# Patient Record
Sex: Female | Born: 1940 | Race: White | Hispanic: No | Marital: Single | Smoking: Former smoker
Health system: Southern US, Community
[De-identification: ages and names within clinical notes are randomized; demographics above are authoritative.]

## PROBLEM LIST (undated history)

## (undated) DIAGNOSIS — M545 Low back pain, unspecified: Secondary | ICD-10-CM

## (undated) DIAGNOSIS — M4800 Spinal stenosis, site unspecified: Secondary | ICD-10-CM

## (undated) DIAGNOSIS — IMO0002 Reserved for concepts with insufficient information to code with codable children: Secondary | ICD-10-CM

## (undated) DIAGNOSIS — M1990 Unspecified osteoarthritis, unspecified site: Secondary | ICD-10-CM

## (undated) HISTORY — DX: Low back pain, unspecified: M54.50

## (undated) HISTORY — DX: Unspecified osteoarthritis, unspecified site: M19.90

## (undated) HISTORY — DX: Spinal stenosis, site unspecified: M48.00

## (undated) HISTORY — DX: Low back pain: M54.5

## (undated) HISTORY — DX: Reserved for concepts with insufficient information to code with codable children: IMO0002

---

## 1997-05-27 ENCOUNTER — Ambulatory Visit (HOSPITAL_COMMUNITY): Admission: RE | Admit: 1997-05-27 | Discharge: 1997-05-27 | Payer: Self-pay | Admitting: Orthopedic Surgery

## 1998-12-05 ENCOUNTER — Encounter: Admission: RE | Admit: 1998-12-05 | Discharge: 1998-12-05 | Payer: Self-pay | Admitting: Obstetrics and Gynecology

## 1999-06-21 ENCOUNTER — Ambulatory Visit (HOSPITAL_COMMUNITY): Admission: RE | Admit: 1999-06-21 | Discharge: 1999-06-21 | Payer: Self-pay | Admitting: Orthopedic Surgery

## 1999-06-21 ENCOUNTER — Encounter: Payer: Self-pay | Admitting: Orthopedic Surgery

## 2000-08-07 ENCOUNTER — Encounter: Payer: Self-pay | Admitting: Obstetrics and Gynecology

## 2000-08-07 ENCOUNTER — Encounter: Admission: RE | Admit: 2000-08-07 | Discharge: 2000-08-07 | Payer: Self-pay | Admitting: Obstetrics and Gynecology

## 2001-05-07 ENCOUNTER — Encounter: Payer: Self-pay | Admitting: Rheumatology

## 2001-05-07 ENCOUNTER — Encounter: Admission: RE | Admit: 2001-05-07 | Discharge: 2001-05-07 | Payer: Self-pay | Admitting: Rheumatology

## 2001-10-20 ENCOUNTER — Encounter: Payer: Self-pay | Admitting: Rheumatology

## 2001-10-20 ENCOUNTER — Encounter: Admission: RE | Admit: 2001-10-20 | Discharge: 2001-10-20 | Payer: Self-pay | Admitting: Rheumatology

## 2001-12-26 ENCOUNTER — Encounter: Admission: RE | Admit: 2001-12-26 | Discharge: 2001-12-26 | Payer: Self-pay | Admitting: Orthopedic Surgery

## 2001-12-26 ENCOUNTER — Encounter: Payer: Self-pay | Admitting: Orthopedic Surgery

## 2002-01-19 ENCOUNTER — Encounter: Payer: Self-pay | Admitting: Orthopedic Surgery

## 2002-01-26 ENCOUNTER — Encounter: Payer: Self-pay | Admitting: Orthopedic Surgery

## 2002-01-26 ENCOUNTER — Inpatient Hospital Stay (HOSPITAL_COMMUNITY): Admission: RE | Admit: 2002-01-26 | Discharge: 2002-01-30 | Payer: Self-pay | Admitting: Orthopedic Surgery

## 2002-01-26 HISTORY — PX: LUMBAR LAMINECTOMY: SHX95

## 2002-01-28 ENCOUNTER — Encounter: Payer: Self-pay | Admitting: Orthopedic Surgery

## 2002-06-23 ENCOUNTER — Ambulatory Visit (HOSPITAL_COMMUNITY): Admission: RE | Admit: 2002-06-23 | Discharge: 2002-06-23 | Payer: Self-pay | Admitting: Obstetrics and Gynecology

## 2002-06-23 ENCOUNTER — Encounter: Payer: Self-pay | Admitting: Obstetrics and Gynecology

## 2003-07-07 ENCOUNTER — Ambulatory Visit (HOSPITAL_COMMUNITY): Admission: RE | Admit: 2003-07-07 | Discharge: 2003-07-07 | Payer: Self-pay | Admitting: Obstetrics and Gynecology

## 2003-11-01 ENCOUNTER — Encounter: Admission: RE | Admit: 2003-11-01 | Discharge: 2003-11-01 | Payer: Self-pay | Admitting: Urology

## 2003-11-05 ENCOUNTER — Ambulatory Visit (HOSPITAL_BASED_OUTPATIENT_CLINIC_OR_DEPARTMENT_OTHER): Admission: RE | Admit: 2003-11-05 | Discharge: 2003-11-05 | Payer: Self-pay | Admitting: Urology

## 2003-11-05 ENCOUNTER — Ambulatory Visit (HOSPITAL_COMMUNITY): Admission: RE | Admit: 2003-11-05 | Discharge: 2003-11-05 | Payer: Self-pay | Admitting: Urology

## 2003-11-05 HISTORY — PX: OTHER SURGICAL HISTORY: SHX169

## 2004-08-22 ENCOUNTER — Ambulatory Visit (HOSPITAL_COMMUNITY): Admission: RE | Admit: 2004-08-22 | Discharge: 2004-08-22 | Payer: Self-pay | Admitting: Obstetrics and Gynecology

## 2004-09-01 ENCOUNTER — Ambulatory Visit (HOSPITAL_COMMUNITY): Admission: RE | Admit: 2004-09-01 | Discharge: 2004-09-01 | Payer: Self-pay | Admitting: Obstetrics and Gynecology

## 2004-09-01 ENCOUNTER — Encounter: Admission: RE | Admit: 2004-09-01 | Discharge: 2004-09-01 | Payer: Self-pay | Admitting: Obstetrics and Gynecology

## 2005-02-23 ENCOUNTER — Encounter: Admission: RE | Admit: 2005-02-23 | Discharge: 2005-02-23 | Payer: Self-pay | Admitting: Internal Medicine

## 2007-06-26 ENCOUNTER — Ambulatory Visit (HOSPITAL_COMMUNITY): Admission: RE | Admit: 2007-06-26 | Discharge: 2007-06-26 | Payer: Self-pay

## 2008-07-15 ENCOUNTER — Ambulatory Visit (HOSPITAL_COMMUNITY): Admission: RE | Admit: 2008-07-15 | Discharge: 2008-07-15 | Payer: Self-pay | Admitting: *Deleted

## 2008-07-30 ENCOUNTER — Encounter: Admission: RE | Admit: 2008-07-30 | Discharge: 2008-07-30 | Payer: Self-pay | Admitting: Internal Medicine

## 2008-09-03 ENCOUNTER — Encounter (INDEPENDENT_AMBULATORY_CARE_PROVIDER_SITE_OTHER): Payer: Self-pay | Admitting: Gastroenterology

## 2008-09-03 ENCOUNTER — Ambulatory Visit (HOSPITAL_COMMUNITY): Admission: RE | Admit: 2008-09-03 | Discharge: 2008-09-03 | Payer: Self-pay | Admitting: Gastroenterology

## 2008-09-03 HISTORY — PX: COLONOSCOPY W/ BIOPSIES: SHX1374

## 2009-07-08 ENCOUNTER — Ambulatory Visit (HOSPITAL_BASED_OUTPATIENT_CLINIC_OR_DEPARTMENT_OTHER): Admission: RE | Admit: 2009-07-08 | Discharge: 2009-07-08 | Payer: Self-pay | Admitting: Surgery

## 2009-07-08 HISTORY — PX: OTHER SURGICAL HISTORY: SHX169

## 2009-11-02 ENCOUNTER — Encounter: Admission: RE | Admit: 2009-11-02 | Discharge: 2009-11-02 | Payer: Self-pay | Admitting: *Deleted

## 2010-01-29 ENCOUNTER — Encounter: Payer: Self-pay | Admitting: Obstetrics and Gynecology

## 2010-05-23 NOTE — Op Note (Signed)
[name]                 ACCOUNT NO.:  [id number]   MEDICAL RECORD NO.:  [id number]          PATIENT TYPE:  AMB   LOCATION:  ENDO                         FACILITY:  [facility]   PHYSICIAN:  Bernette Redbird, M.D.   DATE OF BIRTH:  [birth year]   DATE OF PROCEDURE:  09/03/2008  DATE OF DISCHARGE:  09/03/2008                               OPERATIVE REPORT   PROCEDURE:  Colonoscopy with biopsy.   INDICATION:  Family history of colon cancer in the patient's mother when  she was in her 27s, now perhaps 7 years status post her most recent  screening exam.   FINDINGS:  Several small sessile polyps.  Mild sigmoid diverticulosis.   PROCEDURE:  The patient was familiar with the procedure from prior  examinations and offered the opportunity to inquire about the risks of  the procedure.  She had provided written consent.  Sedation was Versed  10 mg IV.  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   The sedation, however, worked well.  She was comfortable and there was  no clinical instability during the procedure.   The Pentax pediatric video colonoscope was advanced quite easily around  the colon to the terminal ileum, applying some external abdominal  compression to help advancement in the proximal colon.   The quality of prep was excellent and it was felt that all areas were  well seen.   The terminal ileum looked normal.   In the ascending colon and in the descending colon at about 35 cm, I  encountered a small sessile roughly 3-mm hyperplastic appearing polyp  removed by cold biopsy in each location.  Two similar polyps were noted  in the proximal rectum at about 15 cm from the anus.   No large polyps, cancer, colitis or vascular ectasia were noted.   There was mild sigmoid  diverticulosis.   Retroflexion in the rectum and reinspection of the rectum showed no  additional findings.   The patient tolerated the procedure well and there were no apparent  complications.   IMPRESSION:  1. Small colon polyps removed as described above.  2. Family history of colon cancer at an advanced age.  3. Mild sigmoid diverticulosis.   PLAN:  Await pathology on the polyps, which will help guide the decision  regarding the patient's surveillance interval before the next  colonoscopy.           ______________________________  Bernette Redbird, M.D.     RB/MEDQ  D:  09/03/2008  T:  09/04/2008  Job:  [id number]   cc:   Dawn Liter, MD

## 2010-05-26 NOTE — Consult Note (Signed)
NAMEJASNOOR, TRUSSELL                           ACCOUNT NO.:  [id number]   MEDICAL RECORD NO.:  [id number]                   PATIENT TYPE:  INP   LOCATION:  0472                                 FACILITY:  [facility]   PHYSICIAN:  Bernette Redbird, M.D.                DATE OF BIRTH:  [birth year]   DATE OF CONSULTATION:  DATE OF DISCHARGE:                                   CONSULTATION   REASON FOR CONSULTATION:  Dr. Mikey Kirschner physician assistant asked me to see  this 70 year old female because of diarrhea.   HISTORY OF PRESENT ILLNESS:  Ms. Babin is two days status post  decompressive L4-L5 laminectomy which went smoothly and she was having a  very nice postoperative convalescence until yesterday evening when severe  abdominal cramps set in and then she developed a temperature to 104 degrees  with white count elevated to 14,000 and diarrhea and crampy abdominal pain.  The stool was Hemoccult positive. Of note, the patient had been on Keflex  for about one week prior to surgery and then had a day or so of Ancef around  the time of surgery. The patient has had previous colonoscopy by Dr.  Sherin Quarry because of a family history of colon cancer. There is no history of  chronic colitis or of chronic diarrhea. CT scan was obtained earlier today  which shows evidence of colitis in the transverse and descending colon  regions. The patient has received one dose of Flagyl so far while two  specimens for C-diff are pending, and the patient has already begun to note  some marked improvement in her symptoms.   PHYSICAL EXAMINATION:  GENERAL: Ms. Rhodus looks well. She is in no distress  whatsoever and is in good spirits.  CHEST: Clear.  HEART: Normal.  ABDOMEN: Has rather active gurgling bowel sounds which are non-obstructive  in character. There is mild subjective abdominal tenderness but really no  significant objective tenderness. No guarding. No mass effect and certainly,  no peritoneal  findings.   IMPRESSION:  Probable C-diff colitis, based on all features, which include  fever, leukocytosis, watery diarrhea and antecedent exposure to antibiotics.  Less likely would be ischemic colitis, given the heme positivity.    PLAN:  Proceed with sigmoidoscopic evaluation tomorrow. The nature, purpose,  and risks were reviewed with the patient and she is agreeable to proceed. We  will also await the C-diff results.                                               Bernette Redbird, M.D.    RB/MEDQ  D:  01/28/2002  T:  01/29/2002  Job:  [id number]   cc:   Hetty Blend, M.D.   Darius Bump, M.D.  [address]  [place]  [place] [address]  Fax: [phone]   Tasia Catchings, M.D.  [address]  [place]  [place] [address]  Fax: [phone]

## 2010-05-26 NOTE — Op Note (Signed)
NAMEGWENDY, [name]                 ACCOUNT NO.:  [id number]   MEDICAL RECORD NO.:  [id number]          PATIENT TYPE:  AMB   LOCATION:  NESC                         FACILITY:  [facility]   PHYSICIAN:  Mark C. Vernie Ammons, M.D.  DATE OF BIRTH:  12-18-1940   DATE OF PROCEDURE:  11/05/2003  DATE OF DISCHARGE:                                 OPERATIVE REPORT   PREOPERATIVE DIAGNOSIS:  Stress urinary incontinence.   POSTOPERATIVE DIAGNOSIS:  Stress urinary incontinence.   PROCEDURE:  Suprapubic sling.   SURGEON:  Mark C. Vernie Ammons, M.D.   DRAINS:  16-French Foley catheter.   BLOOD LOSS:  Approximately 25 cc.   SPECIMENS:  None.   COMPLICATIONS:  None.   INDICATIONS:  The patient is a 70 year old white female with pure stress  urinary incontinence. It has been present for some time and progressively  worsening. She is brought to the OR today for sling procedure and  understands the risks, complications, and alternatives to the procedure.   DESCRIPTION OF OPERATION:  After informed consent, the patient was brought  to the OR, placed on a table, and administered general anesthesia, then  moved to the dorsal lithotomy position. Her lower abdomen, genitalia, and  vagina were then sterilely prepped and draped. A 16-French Foley catheter  was placed in the bladder and a weighted speculum in the vagina. I then  infiltrated the suburethral mucosa with 1% lidocaine with epinephrine. After  allowing adequate time for epinephrine effect during which time I made a  suprapubic stab incisions at the superior border of the symphysis pubis on  the right and left side approximately four fingerbreadths apart. I then made  an incision at the mid urethral level, through the vaginal mucosa, and used  the Metzenbaum scissors to dissect the vaginal mucosa from the periurethral  region until I could easily place an index finger in this location and  palpate the undersurface of the symphysis pubis. This was  performed on the  right and left side. I then completely drained the bladder through the Foley  catheter and passed the sling trocar through the suprapubic incision, back  behind the symphysis pubis first on the left side, and out through the mid  urethra located by palpation. The blood tubing which was connected to the  trocar was then pulled through, cut, and clamped on each side, and the  trocar reloaded and passed on the right side in identical fashion. Leaving  the blue tubing on both sides, I then removed the Foley catheter and  inserted the cystoscope with a 70-degree lens. The bladder was fully  inspected and noted to be free of any tumors, stones, or inflammatory  lesions. There was an area on the right hand side where it appeared the  tubing had transversed the bladder mucosa where just a small portion of the  blue tubing could be seen approximately half a centimeter in length. I  therefore removed the tubing on the right hand side, repassed the trocar  with the bladder empty, and reinspected the bladder noting no perforation,  foreign body, or other  abnormality. I then affixed the obturator tape  material to the ends of each of the blue tubes, coming through the vaginal  incision, brought these up through the suprapubic stab incisions, then  adjusted the tape at the mid urethral level with a right ankle clamp placed  beneath this. I removed the plastic sheathing over the obturator tape. After  doing this, I made sure that the tape was under no tension and excised the  excess material. The suprapubic stab incisions as well as the vaginal  incision were then copiously irrigated with antibiotic solution. No bleeding  was noted to be occurring from any of the incisions, so I therefore closed  the vaginal incisions with a running 2-0 Vicryl suture. The suprapubic  incisions were closed with Dermabond. The Foley catheter will be left  indwelling overnight and removed by the patient in  the morning. She will  follow up with me in the office in 1 week. She will be given a prescription  for Vicoprofen #36 and Cipro 250 mg b.i.d. [place]   MCO/MEDQ  D:  11/05/2003  T:  11/05/2003  Job:  [id number]
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
## 2010-05-26 NOTE — Op Note (Signed)
[name], [name]                           ACCOUNT NO.:  [id number]   MEDICAL RECORD NO.:  [id number]                   PATIENT TYPE:  INP   LOCATION:  X001                                 FACILITY:  [facility]   PHYSICIAN:  Georges Lynch. Darrelyn Hillock, M.D.             DATE OF BIRTH:  [birth year]   DATE OF PROCEDURE:  01/26/2002  DATE OF DISCHARGE:                                 OPERATIVE REPORT   SURGEON:  Georges Lynch. Darrelyn Hillock, M.D.   ASSISTANT:  Jene Every, M.D.   PREOPERATIVE DIAGNOSES:  Recess and spinal stenosis at L4-5 and also L3-4.  All of her symptoms were in the right lower extremity and rather severe. Her  stenosis was proven by a myelogram and CAT scan.   POSTOPERATIVE DIAGNOSES:  Recess and spinal stenosis at L4-5 and also L3-4.  All of her symptoms were in the right lower extremity and rather severe.   OPERATION:  Lumbar decompressive lumbar laminectomy complete at L4-5 and  hemilaminectomy at L3-4 and L5-S1 bilaterally with foraminotomies.   DESCRIPTION OF PROCEDURE:  Under general anesthesia, the patient on a spinal  frame, routine orthopedic prep and draping of the back was carried out. She  had 1 gm of IV Ancef. Two needles were placed on the back for localization  purposes, an x-ray was taken to verify our position. Once we identified the  spinous processes of L4 and L5. An incision was made and bleeders identified  and cauterized, the muscle was stripped from the spinous processes of L3, L4  and L5 and we went down and identified the lamina. Following this, we then  inserted our self retaining retractors, another x-ray was taken to verify  our L4-5 position. I then removed the spinous process of L4, removed part of  the spinous process of L5 and part of L3. I then did a complete bilateral  decompressive lumbar laminectomy at L4-5 and I did hemilaminectomies above  at 3-4 region as well as 5-1 distally. We did foraminotomies. She had a  markedly thickened overgrowth of  the ligamentum flavum. This was actually  indenting her thecal sac but no herniated disk. We did a nice decompression,  we were able to easily pass the hockey stick out the foramina after we  completed the procedure. We had good control of the hemostasis, thoroughly  irrigated out the wound. We loosely applied some thrombin soaked Gelfoam to  the dura and then closed the wound in layers in the usual fashion. I did  leave the deep portion of the wound open distally for drainage purposes in  case she did have any bleeding so she decompress the dura. Sterile dressings  were applied. She left the operating room in satisfactory condition.  Ronald A. Darrelyn Hillock, M.D.    RAG/MEDQ  D:  01/26/2002  T:  01/26/2002  Job:  [id number]

## 2010-05-26 NOTE — Op Note (Signed)
[name], [name]                           ACCOUNT NO.:  [id number]   MEDICAL RECORD NO.:  [id number]                   PATIENT TYPE:  INP   LOCATION:  0472                                 FACILITY:  [facility]   PHYSICIAN:  Bernette Redbird, M.D.                DATE OF BIRTH:  04/18/1940   DATE OF PROCEDURE:  01/29/2002  DATE OF DISCHARGE:                                 OPERATIVE REPORT   PROCEDURE:  Flexible sigmoidoscopy.   INDICATION:  A 70 year old female now three days status post lumbar  laminectomy, who developed abdominal cramps, diarrhea, and low-grade fevers  about a day and a half postop.  C. difficile studies are pending.  Her CT  scan raised the question of some patchy colitis in the transverse and  descending colon.   FINDINGS:  Normal exam to 45 cm except a few small tics.   DESCRIPTION OF PROCEDURE:  The nature, purpose, and risks of the procedure  had been discussed with the patient who provided written consent.  She was  brought to the endoscopy unit.  No prep was administered in view of her  recent diarrhea.   The Olympus adjustable-tension pediatric video colonoscope was advanced to  45 cm without significant difficulty, but there upon we began to experience  some discomfort due to looping.  It was elected to terminate the procedure  at this point and begin withdrawal.   There were a few small diverticula, but this was otherwise a normal exam.  No polyps, cancer and in particular, no pseudomembranes or evidence of  colitis were noted up to the level of the exam which was thought to have  included the distal descending colon.  No biopsies were obtained.  The  patient tolerated the procedure well, and there were no apparent  complications.  Retroflexion was not performed.   IMPRESSION:  No abnormalities to account for diarrhea or radiographic  abnormalities on the CT scan.   PLAN:  1. It is possible that the patient does have C. difficile colitis but  that     the distribution of that has spared the rectosigmoid region (this would     be unusual, but certainly possible).  2. Therefore, I would favor continuing empiric Flagyl, at least until the C.     difficile specimens (there are two pending at this time, and they should     be ready in the next 24 hours) come back negative.  3. I think it is okay to advance the patient to a low residue diet.  4. We need to continue to monitor her symptoms and overall clinical status.                                               Molly Maduro  Buccini, M.D.    RB/MEDQ  D:  01/29/2002  T:  01/29/2002  Job:  [id number]   cc:   Windy Fast A. Darrelyn Hillock, M.D.  [address]  [place]  [place] [address]  Fax: [phone]   Tasia Catchings, M.D.  [address]  [place]  [place] [address]  Fax: [phone]   Darius Bump, M.D.  [phone]  [place] [address]  Fax: [phone]

## 2010-05-26 NOTE — H&P (Signed)
[name], [name]                           ACCOUNT NO.:  [id number]   MEDICAL RECORD NO.:  [id number]                   PATIENT TYPE:  INP   LOCATION:  NA                                   FACILITY:  [facility]   PHYSICIAN:  Ebbie Ridge. Paitsel, P.A.               DATE OF BIRTH:  10/26/1940   DATE OF ADMISSION:  DATE OF DISCHARGE:                                HISTORY & PHYSICAL   DISPOSITION:  For surgery January 26, 2002.   CHIEF COMPLAINT:  Low back pain for 4-5 months.   HISTORY OF PRESENT ILLNESS:  The patient has had back pain radiating into  her right buttock and right thigh for the past 4-5 months.  The patient has  occasional numbness and tingling in her right foot.  The pain has been  progressing in severity.  She is having some weakness in her leg at this  time.  Due to the progressive nature of her pain, the patient underwent a CT  myelogram which revealed severe spinal stenosis at L4-L5.  The patient will  need a complete bilateral decompressive lumbar laminectomy at L4-L5 with  foraminotomies.  The patient has elected to go ahead and have this surgery  done to try to relieve her symptoms.   ALLERGIES:  None known.   MEDICATIONS:  1. Premarin 0.625 mg daily.  2. Vioxx 25 mg daily.   PAST MEDICAL HISTORY:  1. Previous UTI in 1982.  2. Osteoarthritis.  3. Degenerative disk disease.   PRIMARY CARE PHYSICIAN:  Darius Bump, M.D.   PAST SURGICAL HISTORY:  1. Bilateral elbow surgeries in 1980s.  2. Hysterectomy in 1980.  3. Blepharoplasty in the 1980s.   FAMILY HISTORY:  Mother had breast and colon cancer.  Father died of lung  cancer.   REVIEW OF SYSTEMS:  GENERAL:  Denies weight changes, fever, chills, fatigue.  HEENT:  Denies headache, visual changes, tinnitus, hearing loss, or sore  throat.  CARDIOVASCULAR:  Denies chest pain, palpitations, shortness of  breath, hypertension.  PULMONARY:  Denies dyspnea, wheezing, cough, sputum,  or hemoptysis.  GI:   Denies dysphagia, nausea, vomiting, hematemesis, or  abdominal pain. GU:  Denies dysuria, frequency, urgency, incontinence, or  hematuria.  ENDOCRINE:  Denies polyuria, polydipsia, appetite change, heat  or cold intolerance.  MUSCULOSKELETAL:  Does have low back pain radiating  into her right buttock and right thigh.  NEUROLOGICAL:  The patient does  have some paresthesias and weakness in her right lower extremity.  Denies  vertigo, syncope, or seizure.  SKIN:  Denies itching, rashes, masses, or  moles.   PHYSICAL EXAMINATION:  VITAL SIGNS:  Temperature 97.8, pulse 72,  respirations 18, blood pressure 138/70 right arm sitting.  GENERAL:  A well-developed, well-nourished white female 70 years old in no  acute distress.  HEENT:  PERRL, EOMs intact, pharynx clear, TMs clear.  NECK:  Supple without  masses, no carotid bruits heard.  CHEST:  Clear to auscultation bilaterally.  No wheezing, rales, or rhonchi  noted.  HEART:  Regular rate and rhythm without murmur, gallop, or rub.  ABDOMEN:  Positive bowel sounds.  Soft, flat, nontender.  No organomegaly or  abnormal masses.  EXTREMITIES:  The patient has full range of motion of her lower extremities.  Distal pulses are intact.  BACK:  Some pain with hyperextension which reproduces her symptoms in her  right buttock and thigh.  SKIN:  Warm and dry although she does have a quarter size area of  inflammation on her right lower extremity.   IMAGING STUDIES:  CT myelogram revealed severe spinal stenosis L4-L5.  MRI  of her LS spine revealed some significant spinal stenosis at multiple levels  the worst at L3-L4, L4-L5.   IMPRESSION:  Severe spinal stenosis L4-L5.   PLAN:  The patient will be started on Keflex 500 mg q.i.d. for five days due  to the area of erythema on her right lower extremity.  Will be admitted to  [facility] January 26, 2002 to undergo complete bilateral  decompressive lumbar laminectomy.                                                Ebbie Ridge. Paitsel, P.A.    LKP/MEDQ  D:  01/22/2002  T:  01/22/2002  Job:  [id number]

## 2010-11-24 ENCOUNTER — Other Ambulatory Visit (HOSPITAL_COMMUNITY): Payer: Self-pay | Admitting: Family Medicine

## 2010-11-24 DIAGNOSIS — Z1231 Encounter for screening mammogram for malignant neoplasm of breast: Secondary | ICD-10-CM

## 2010-12-26 ENCOUNTER — Ambulatory Visit (HOSPITAL_COMMUNITY): Payer: Self-pay

## 2011-02-01 ENCOUNTER — Ambulatory Visit (HOSPITAL_COMMUNITY): Payer: Commercial Managed Care - PPO | Attending: Family Medicine

## 2011-03-07 ENCOUNTER — Encounter: Payer: Self-pay | Admitting: Vascular Surgery

## 2011-03-09 ENCOUNTER — Encounter: Payer: Self-pay | Admitting: Vascular Surgery

## 2011-03-12 ENCOUNTER — Encounter: Payer: Self-pay | Admitting: Vascular Surgery

## 2011-03-12 ENCOUNTER — Ambulatory Visit (INDEPENDENT_AMBULATORY_CARE_PROVIDER_SITE_OTHER): Payer: Commercial Managed Care - PPO | Admitting: *Deleted

## 2011-03-12 ENCOUNTER — Ambulatory Visit (INDEPENDENT_AMBULATORY_CARE_PROVIDER_SITE_OTHER): Payer: Commercial Managed Care - PPO | Admitting: Vascular Surgery

## 2011-03-12 VITALS — BP 141/91 | HR 87 | Resp 18 | Ht 65.0 in | Wt 147.0 lb

## 2011-03-12 DIAGNOSIS — M7989 Other specified soft tissue disorders: Secondary | ICD-10-CM

## 2011-03-12 DIAGNOSIS — I83893 Varicose veins of bilateral lower extremities with other complications: Secondary | ICD-10-CM | POA: Insufficient documentation

## 2011-03-12 NOTE — Progress Notes (Signed)
Addended by: Clementeen Hoof on: 03/12/2011 04:24 PM   Modules accepted: Orders

## 2011-03-12 NOTE — Progress Notes (Signed)
Subjective:     Patient ID: [name], female   DOB: [birth year], 71 y.o.   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 She describes throbbing aching and burning discomfort in the distal thigh and calf as the day progresses. 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 She does were short-leg elastic compression stockings which did not completely relieve her symptoms. She does develop increasing edema as the day progresses. She has no history of stasis ulcers, bleeding, thrombophlebitis, or DVT. She is unable to elevate her legs at work. He has no significant symptoms in the right leg. She has a remote history of sclerotherapy for spider veins in the right thigh  Past Medical History  Diagnosis Date  . Osteoarthritis   . Low back pain   . Degenerative disc disease   . Spinal stenosis     L4-L5    History  Substance Use Topics  . Smoking status: Former Smoker -- 5 years    Types: Cigarettes    Quit date: 03/12/1978  . Smokeless tobacco: Not on file  . Alcohol Use: Yes    Family History  Problem Relation Age of Onset  . Cancer Mother     BREAST AND COLON  . Cancer Father     LUNG    Allergies  Allergen Reactions  . Keflex     No current outpatient prescriptions on file.  BP 141/91  Pulse 87  Resp 18  Ht 5\' 5"  (1.651 m)  Wt 147 lb (66.679 kg)  BMI 24.46 kg/m2  Body mass index is 24.46 kg/(m^2).        Review of Systems denies chest pain, dyspnea on exertion, PND, orthopnea, claudication. Does have multiple joint problems with osteoarthritis. All other systems are negative and a complete review of systems     Objective:   Physical Exam pressure 141/91 heart rate 87 respirations 18 Gen.-alert and oriented x3 in no apparent distress HEENT normal for age Lungs no rhonchi or wheezing Cardiovascular regular rhythm no murmurs carotid pulses 3+  palpable no bruits audible Abdomen soft nontender no palpable masses Musculoskeletal free of  major deformities Skin clear -no rashes Neurologic normal Lower extremities 3+ femoral and dorsalis pedis pulses palpable bilaterally with 1+ edema on the left. Bulging varicosities in the left leg beginning in the medial thigh extending anterolaterally across the knee and down into the pre-tibial region and also medially proximal to the medial malleolus. There are diffuse spider veins in the right leg on the anterior thigh. There is no hyperpigmentation or ulceration noted.  Today I ordered bilateral venous duplex exam which are reviewed and interpreted. Left leg has no DVT has gross reflux in the great saphenous vein from the saphenofemoral junction to the knee supplying these varicosities. Right leg has some mild reflux throughout the great saphenous vein but it is not a large vein and there is no DVT on the right.    Assessment:     Severe venous insufficiency left leg with gross reflux left great saphenous veins with secondary bulging varicosities which are increasingly symptomatic. These are affecting her daily living and ability to work.    Plan:     #1 long-leg elastic compression stockings 20-30 mm gradient #2 elevation legs when feasible #3 ibuprofen on a daily basis #4 return in 3 months. If there is a significant improvement in symptomatology left leg  believe we should proceed with laser ablation left great saphenous vein with followup 3 months later to see a stab phlebectomy will be necessary

## 2011-03-21 NOTE — Procedures (Unsigned)
LOWER EXTREMITY VENOUS REFLUX EXAM  INDICATION:  Swelling.  EXAM:  Using color-flow imaging and pulse Doppler spectral analysis, the bilateral common femoral, superficial femoral, popliteal, posterior tibial, greater and lesser saphenous veins are evaluated.  There is evidence suggesting deep venous reflux of >500 milliseconds noted in the bilateral common femoral veins.  The bilateral saphenofemoral junctions and nontortuous GSV's demonstrate reflux of >573milliseconds with the calibers as described below.  The bilateral proximal short saphenous vein demonstrate competency.  GSV Diameter (used if found to be incompetent only)                                                 Right     Left Proximal Greater Saphenous Vein                 0.35 cm   0.54 cm Proximal-to-mid-thigh                           cm        cm Mid thigh                                       0.33 cm   0.54 cm Mid-distal thigh                                cm        cm Distal thigh                                    0.34 cm   0.64 cm Knee                                            0.23 cm   0.45 cm  IMPRESSION:  Bilateral greater saphenous and common femoral vein reflux is noted, as described above.      ___________________________________________ Quita Skye. Hart Rochester, M.D.  CH/MEDQ  D:  03/13/2011  T:  03/13/2011  Job:  [id number]

## 2011-06-08 ENCOUNTER — Ambulatory Visit
Admission: RE | Admit: 2011-06-08 | Discharge: 2011-06-08 | Disposition: A | Discharge status: Home / Self Care | Payer: Commercial Managed Care - PPO | Source: Ambulatory Visit | Attending: Family Medicine | Admitting: Family Medicine

## 2011-06-08 ENCOUNTER — Encounter: Payer: Self-pay | Admitting: Vascular Surgery

## 2011-06-08 DIAGNOSIS — Z1231 Encounter for screening mammogram for malignant neoplasm of breast: Secondary | ICD-10-CM

## 2011-06-11 ENCOUNTER — Encounter: Payer: Self-pay | Admitting: Vascular Surgery

## 2011-06-11 ENCOUNTER — Ambulatory Visit (INDEPENDENT_AMBULATORY_CARE_PROVIDER_SITE_OTHER): Payer: Commercial Managed Care - PPO | Admitting: Vascular Surgery

## 2011-06-11 VITALS — BP 129/78 | HR 80 | Resp 18 | Ht 65.0 in | Wt 140.0 lb

## 2011-06-11 DIAGNOSIS — I83893 Varicose veins of bilateral lower extremities with other complications: Secondary | ICD-10-CM

## 2011-06-11 NOTE — Progress Notes (Signed)
Subjective:     Patient ID: [name], female   DOB: 1940-09-24, 71 y.o.   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 She continues to have aching throbbing and burning discomfort in the thigh and calf as the day progresses. 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 She has been wearing long leg elastic compression stockings 20-30 mm gradient. She is unable to elevate her legs at work. She has tried ibuprofen as well. Her symptoms are unchanged and are affecting her daily living and ability to work. She has no history of DVT thrombophlebitis stasis ulcers or bleeding.  Past Medical History  Diagnosis Date  . Osteoarthritis   . Low back pain   . Degenerative disc disease   . Spinal stenosis     L4-L5    History  Substance Use Topics  . Smoking status: Former Smoker -- 5 years    Types: Cigarettes    Quit date: 03/12/1978  . Smokeless tobacco: Not on file  . Alcohol Use: Yes    Family History  Problem Relation Age of Onset  . Cancer Mother     BREAST AND COLON  . Cancer Father     LUNG    Allergies  Allergen Reactions  . Cephalexin     No current outpatient prescriptions on file.  BP 129/78  Pulse 80  Resp 18  Ht 5\' 5"  (1.651 m)  Wt 140 lb (63.504 kg)  BMI 23.30 kg/m2  Body mass index is 23.30 kg/(m^2).          Review of Systems denies chest pain, dyspnea on exertion, PND, orthopnea, claudication.    Objective:   Physical Exam blood pressure 129/78 heart rate 80 respirations 18 General well-developed well-nourished female in no apparent distress alert and oriented x3 HEENT normal for age Lungs no rhonchi or wheezing Lower extremity-left with 3+ femoral and posterior tibial pulse palpable. Her bulging varicosities beginning in the mid thigh extending anterolaterally across the knee into the pretibial region. She has 1+ edema distally  with no hyperpigmentation or ulceration  She has documented gross reflux in the left great saphenous vein from the knee to the saphenofemoral junction with no DVT      Assessment:     Severe venous insufficiency left leg with secondary varicosities and venous hypertension causing symptoms which are resistant to conservative measures-affecting her daily living and ability to work    Plan:     Patient needs #1 laser ablation left great saphenous vein. She will then return in 3 months to see if stab phlebectomy of secondary varicosities would be indicated

## 2011-07-03 ENCOUNTER — Other Ambulatory Visit: Payer: Self-pay | Admitting: *Deleted

## 2011-07-03 DIAGNOSIS — I83893 Varicose veins of bilateral lower extremities with other complications: Secondary | ICD-10-CM

## 2011-08-03 ENCOUNTER — Encounter: Payer: Self-pay | Admitting: Vascular Surgery

## 2011-08-06 ENCOUNTER — Encounter: Payer: Self-pay | Admitting: Vascular Surgery

## 2011-08-06 ENCOUNTER — Ambulatory Visit (INDEPENDENT_AMBULATORY_CARE_PROVIDER_SITE_OTHER): Payer: Commercial Managed Care - PPO | Admitting: Vascular Surgery

## 2011-08-06 VITALS — BP 134/79 | HR 74 | Resp 16 | Ht 65.0 in | Wt 140.0 lb

## 2011-08-06 DIAGNOSIS — I83893 Varicose veins of bilateral lower extremities with other complications: Secondary | ICD-10-CM

## 2011-08-06 HISTORY — PX: ENDOVENOUS ABLATION SAPHENOUS VEIN W/ LASER: SUR449

## 2011-08-06 NOTE — Progress Notes (Signed)
Subjective:     Patient ID: [name], female   DOB: May 19, 1940, 71 y.o.   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 She tolerated the procedure well. A total of 1875 J of energy was utilized.  Review of Systems     Objective:   Physical ExamBP 134/79  Pulse 74  Resp 16  Ht 5\' 5"  (1.651 m)  Wt 140 lb (63.504 kg)  BMI 23.30 kg/m2      Assessment:     Well-tolerated laser ablation left great saphenous vein from proximal calf to the saphenofemoral junction performed under local tumescent anesthesia    Plan:     Return in 2 weeks for venous duplex exam to confirm closure left great saphenous vein. Then return in 3 months to see if stab phlebectomy is will be indicated for secondary varicosities

## 2011-08-06 NOTE — Progress Notes (Signed)
Laser Ablation Procedure      Date: 08/06/2011    [name] DOB:1940-04-09  Consent signed: Yes  Surgeon:J.D. Hart Rochester  Procedure: Laser Ablation: left Greater Saphenous Vein  BP 134/79  Pulse 74  Resp 16  Ht 5\' 5"  (1.651 m)  Wt 140 lb (63.504 kg)  BMI 23.30 kg/m2  Start time: 9:00   End time: 9:40  Tumescent Anesthesia: 325 cc 0.9% NaCl with 50 cc Lidocaine HCL with 1% Epi and 15 cc 8.4% NaHCO3  Local Anesthesia: 7 cc Lidocaine HCL and NaHCO3 (ratio 2:1)  Pulsed mode: Watts 15 Seconds 1 Pulses:1 Total Pulses:125 Total Energy: 1875 Total Time: 2:05     Patient tolerated procedure well: Yes  Notes:   Description of Procedure:  After marking the course of the saphenous vein and the secondary varicosities in the standing position, the patient was placed on the operating table in the supine position, and the left leg was prepped and draped in sterile fashion. Local anesthetic was administered, and under ultrasound guidance the saphenous vein was accessed with a micro needle and guide wire; then the micro puncture sheath was placed. A guide wire was inserted to the saphenofemoral junction, followed by a 5 french sheath.  The position of the sheath and then the laser fiber below the junction was confirmed using the ultrasound and visualization of the aiming beam.  Tumescent anesthesia was administered along the course of the saphenous vein using ultrasound guidance. Protective laser glasses were placed on the patient, and the laser was fired at 15 watt pulsed mode advancing 1-2 mm per sec.  For a total of 1875 joules.  A steri strip was applied to the puncture site.    ABD pads and thigh high compression stockings were applied.  Ace wrap bandages were applied over the phlebectomy sites and at the top of the saphenofemoral junction.  Blood loss was less than 15 cc.  The patient ambulated out of the operating room having tolerated the procedure well.

## 2011-08-07 ENCOUNTER — Encounter: Payer: Self-pay | Admitting: Vascular Surgery

## 2011-08-13 ENCOUNTER — Encounter: Payer: Self-pay | Admitting: Vascular Surgery

## 2011-08-14 ENCOUNTER — Ambulatory Visit (INDEPENDENT_AMBULATORY_CARE_PROVIDER_SITE_OTHER): Payer: Commercial Managed Care - PPO | Admitting: Vascular Surgery

## 2011-08-14 ENCOUNTER — Encounter: Payer: Self-pay | Admitting: Vascular Surgery

## 2011-08-14 ENCOUNTER — Encounter (INDEPENDENT_AMBULATORY_CARE_PROVIDER_SITE_OTHER): Payer: Commercial Managed Care - PPO | Admitting: *Deleted

## 2011-08-14 VITALS — BP 122/80 | HR 79 | Resp 18 | Ht 65.0 in | Wt 147.6 lb

## 2011-08-14 DIAGNOSIS — I83893 Varicose veins of bilateral lower extremities with other complications: Secondary | ICD-10-CM

## 2011-08-14 DIAGNOSIS — Z48812 Encounter for surgical aftercare following surgery on the circulatory system: Secondary | ICD-10-CM

## 2011-08-14 NOTE — Progress Notes (Signed)
The patient presents today one week followup after her ablation of her left great saphenous vein with Dr. Hart Rochester. She does have some erythema and tenderness in her medial thigh is suspected. She has been compliant with her compression garment.  Physical exam reveals typical erythema and thickening with no evidence of more distal swelling  Venous duplex today shows ablation of her great saphenous vein to just below the saphenofemoral junction and no evidence of DVT  She does have decompression of the tributary varicosities a slight degree. She will continue her compression garment for one week and will see Dr. Hart Rochester in 3 months for continued followup

## 2011-11-13 ENCOUNTER — Ambulatory Visit: Payer: Commercial Managed Care - PPO | Admitting: Vascular Surgery

## 2011-11-23 ENCOUNTER — Other Ambulatory Visit: Payer: Self-pay

## 2011-11-30 ENCOUNTER — Encounter: Payer: Self-pay | Admitting: Vascular Surgery

## 2011-12-03 ENCOUNTER — Encounter: Payer: Self-pay | Admitting: Vascular Surgery

## 2011-12-03 ENCOUNTER — Ambulatory Visit (INDEPENDENT_AMBULATORY_CARE_PROVIDER_SITE_OTHER): Payer: Commercial Managed Care - PPO | Admitting: Vascular Surgery

## 2011-12-03 VITALS — BP 123/80 | HR 78 | Resp 18 | Ht 65.0 in | Wt 149.0 lb

## 2011-12-03 DIAGNOSIS — I83893 Varicose veins of bilateral lower extremities with other complications: Secondary | ICD-10-CM

## 2011-12-03 NOTE — Progress Notes (Signed)
Subjective:     Patient ID: [name], female   DOB: [birth year], 71 y.o.   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 She continues to have these symptoms despite wearing long leg elastic compression stockings. She does note that the bulges are less "tense" and they were prior to the laser ablation. She continues to have mild edema distally. She has no history of stasis ulcers or bleeding.  Past Medical History  Diagnosis Date  . Osteoarthritis   . Low back pain   . Degenerative disc disease   . Spinal stenosis     L4-L5    History  Substance Use Topics  . Smoking status: Former Smoker -- 5 years    Types: Cigarettes    Quit date: 03/12/1978  . Smokeless tobacco: Never Used  . Alcohol Use: Yes    Family History  Problem Relation Age of Onset  . Cancer Mother     BREAST AND COLON  . Cancer Father     LUNG    Allergies  Allergen Reactions  . Cephalexin     No current outpatient prescriptions on file.  BP 123/80  Pulse 78  Resp 18  Ht 5\' 5"  (1.651 m)  Wt 149 lb (67.586 kg)  BMI 24.79 kg/m2  Body mass index is 24.79 kg/(m^2).        Review of Systems denies chest pain, dyspnea on exertion, PND, orthopnea, hemoptysis     Objective:   Physical Exam blood pressure 120 or regular rate 78 respirations 18 General well-developed well-nourished female in no apparent stress alert and oriented x3 Lungs no rhonchi or wheezing Lower extremity exam on the left reveals 3+ femoral popliteal and dorsalis pedis pulses palpable. There are bulging varicosities beginning in the distal thigh extending over the knee anteriorly into the pretibial region 1+ edema is present distally. There is no active ulceration or hyperpigmentation noted. Right leg has some scattered spider veins and no bulging varicosities.       Assessment:     Residual bulging varicosities status post laser ablation left great saphenous vein 3 months ago. These are resistant to conservative measures i.e. elastic compression stockings, elevation when she is able to, and ibuprofen    Plan:     Recommend stab phlebectomy of secondary varicosities left leg-10-20 to be performed under local tumescent anesthesia Will proceed with precertification to perform this in near future

## 2012-01-18 ENCOUNTER — Encounter: Payer: Self-pay | Admitting: Vascular Surgery

## 2012-01-21 ENCOUNTER — Other Ambulatory Visit: Payer: Commercial Managed Care - PPO | Admitting: Vascular Surgery

## 2012-01-21 ENCOUNTER — Encounter: Payer: Self-pay | Admitting: Vascular Surgery

## 2012-01-21 ENCOUNTER — Ambulatory Visit (INDEPENDENT_AMBULATORY_CARE_PROVIDER_SITE_OTHER): Payer: Commercial Managed Care - PPO | Admitting: Vascular Surgery

## 2012-01-21 VITALS — BP 166/84 | HR 86 | Resp 16 | Ht 65.0 in | Wt 149.0 lb

## 2012-01-21 DIAGNOSIS — I83893 Varicose veins of bilateral lower extremities with other complications: Secondary | ICD-10-CM

## 2012-01-21 NOTE — Progress Notes (Signed)
Subjective:     Patient ID: [name], female   DOB: [birth year], 72 y.o.   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 She previously had laser ablation of the left great saphenous vein about 3 months ago. She tolerated the procedure well.  Review of Systems     Objective:   Physical ExamBP 166/84  Pulse 86  Resp 16  Ht 5\' 5"  (1.651 m)  Wt 149 lb (67.586 kg)  BMI 24.79 kg/m2       Assessment:     Well-tolerated stab phlebectomy is painful varicosities left leg performed under local tumescent anesthesia    Plan:     Return in 6-8 weeks for final followup

## 2012-01-21 NOTE — Progress Notes (Signed)
Stab Phlebectomies      Date: 01/21/2012    [name] DOB:March 30, 1940  Consent signed: Yes  Surgeon:J.D. Hart Rochester  Procedure: Stab Phlebectomies left leg  BP 166/84  Pulse 86  Resp 16  Ht 5\' 5"  (1.651 m)  Wt 149 lb (67.586 kg)  BMI 24.79 kg/m2  Start time: 12:50   End time: 1:15  Tumescent Anesthesia: 80 cc 0.9% NaCl with 50 cc Lidocaine HCL with 1% Epi and 15 cc 8.4% NaHCO3  Local Anesthesia: 6 cc Lidocaine HCL and NaHCO3 (ratio 2:1)      Stab Phlebectomy: 10-20 Sites: Calf  Patient tolerated procedure well: Yes  Notes:   Description of Procedure:    The patient was  put into Trendelenburg position.  Local anesthetic was utilized overlying the marked varicosities.  Ten to 20 stab wounds were made using the tip of an 11 blade; and using the vein hook,  The phlebectomies were performed using a hemostat to avulse these varicosities.  Adequate hemostasis was achieved, and steri strips were applied to the stab wound.      ABD pads and thigh high compression stockings were applied.  Ace wrap bandages were applied over the phlebectomy sites. Blood loss was less than 15 cc.  The patient ambulated out of the operating room having tolerated the procedure well.

## 2012-01-22 ENCOUNTER — Telehealth: Payer: Self-pay | Admitting: *Deleted

## 2012-01-22 NOTE — Telephone Encounter (Signed)
Patient doing well. No bleeding from her stab sites. Will see her in 6 weeks.

## 2012-01-28 ENCOUNTER — Ambulatory Visit: Payer: Commercial Managed Care - PPO | Admitting: Vascular Surgery

## 2012-03-10 ENCOUNTER — Ambulatory Visit: Payer: Commercial Managed Care - PPO | Admitting: Vascular Surgery

## 2012-04-01 ENCOUNTER — Ambulatory Visit (INDEPENDENT_AMBULATORY_CARE_PROVIDER_SITE_OTHER): Payer: 59 | Admitting: Diagnostic Neuroimaging

## 2012-04-01 ENCOUNTER — Encounter: Payer: Self-pay | Admitting: Diagnostic Neuroimaging

## 2012-04-01 VITALS — BP 144/76 | HR 84 | Ht 65.5 in | Wt 153.0 lb

## 2012-04-01 DIAGNOSIS — B0222 Postherpetic trigeminal neuralgia: Secondary | ICD-10-CM | POA: Insufficient documentation

## 2012-04-01 MED ORDER — DULOXETINE HCL 30 MG PO CPEP: 30.0000 mg | ORAL_CAPSULE | Freq: Every day | ORAL | Status: DC

## 2012-04-01 NOTE — Patient Instructions (Signed)
Try cymbalta 30mg  daily x 1-2 weeks; then increase to 60mg  daily.

## 2012-04-01 NOTE — Progress Notes (Signed)
[facility]  PATIENT: [name] DOB: 08-05-1940  REFERRING CLINICIAN: Fulp HISTORY FROM: patient REASON FOR VISIT: post herpetic neurlagia   HISTORICAL  CHIEF COMPLAINT:  Chief Complaint  Patient presents with  . Facial Pain    right facial post herpetic neuralgia    HISTORY OF PRESENT ILLNESS:  72 year old right-handed female here for evaluation of postherpetic neuralgia.  Beginning of January 2014, patient developed right frontal headache, followed by rash over her right eyebrow, right eyelashes, right maxillary region and right side of her nose. Patient was diagnosed with shingles and treated with Valtrex. Patient was evaluated by ophthalmology who detected 1 "spot on the cornea" and she was treated with steroid drops. Eventually her rash subsided. Patient continues to have significant numbness, itching, burning sensation on her right eyebrow and forehead. She also has significant sensitivity to light. She does report decreased hearing in her right ear, balance difficulty since that time.  Patient has tried tramadol and Xanax which seemed to help with the pain. She tried gabapentin previously but this caused side effects of anxiety and restlessness.  Patient was under increased stress in November and December. Patient is also having concerns about being able to return to work.   REVIEW OF SYSTEMS: Full 14 system review of systems performed and notable only for right-sided hearing loss, right facial itching, light sensitivity, decreased focus and concentration, headache.  ALLERGIES: Allergies  Allergen Reactions  . Cephalexin   . Codeine     HOME MEDICATIONS: No outpatient prescriptions prior to visit.   No facility-administered medications prior to visit.    PAST MEDICAL HISTORY: Past Medical History  Diagnosis Date  . Osteoarthritis   . Low back pain   . Degenerative disc disease   . Spinal stenosis     L4-L5  . Arthritis      PAST SURGICAL HISTORY: Past Surgical History  Procedure Laterality Date  . Excision of mass  07-08-2009    upper back  . Lumbar laminectomy  01-26-2002    L4-L5  . Suprapubic sling  11-05-2003  . Colonoscopy w/ biopsies  09-03-2008  . Endovenous ablation saphenous vein w/ laser  08-06-2011    left greater saphenous vein  by Dr. Hart Rochester    FAMILY HISTORY: Family History  Problem Relation Age of Onset  . Cancer Mother     BREAST AND COLON  . Cancer Father     LUNG  . Cancer Brother     LEUKEMIA    SOCIAL HISTORY:  History   Social History  . Marital Status: Single    Spouse Name: N/A    Number of Children: N/A  . Years of Education: N/A   Occupational History  . RN  [employer]   Social History Main Topics  . Smoking status: Former Smoker -- 5 years    Types: Cigarettes    Quit date: 03/12/1978  . Smokeless tobacco: Never Used  . Alcohol Use: Yes     Comment: socially  . Drug Use: No  . Sexually Active: Not on file   Other Topics Concern  . Not on file   Social History Narrative   Pt lives at home with her mother. She is divorced, does not have any children, and has a Hydrologist level. She drinks 2-3 cups of caffeine daily.     PHYSICAL EXAM  Filed Vitals:   04/01/12 0928  BP: 144/76  Pulse: 84  Height: 5' 5.5" (1.664 m)  Weight: 153 lb (69.4  kg)   Body mass index is 25.06 kg/(m^2).  GENERAL EXAM: Patient is in no distress  CARDIOVASCULAR: Regular rate and rhythm, no murmurs, no carotid bruits  NEUROLOGIC: MENTAL STATUS: awake, alert, language fluent, comprehension intact, naming intact CRANIAL NERVE: no papilledema on fundoscopic exam, pupils equal and reactive to light, visual fields full to confrontation, extraocular muscles intact, no nystagmus, facial sensation and strength symmetric, uvula midline, shoulder shrug symmetric, tongue midline. MOTOR: normal bulk and tone, full strength in the BUE, BLE SENSORY: normal and symmetric to  light touch, pinprick, temperature, vibration COORDINATION: finger-nose-finger, fine finger movements normal REFLEXES: deep tendon reflexes present and symmetric GAIT/STATION: narrow based gait; able to walk on toes, heels and tandem; romberg is BORDERLINE.  DIAGNOSTIC DATA (LABS, IMAGING, TESTING) - I reviewed patient records, labs, notes, testing and imaging myself where available.  No results found for this basename: WBC, HGB, HCT, MCV, PLT   No results found for this basename: na, k, cl, co2, glucose, bun, creatinine, calcium, prot, albumin, ast, alt, alkphos, bilitot, gfrnonaa, gfraa   No results found for this basename: CHOL, HDL, LDLCALC, LDLDIRECT, TRIG, CHOLHDL   No results found for this basename: HGBA1C   No results found for this basename: VITAMINB12   No results found for this basename: TSH    ASSESSMENT AND PLAN  73 y.o. year old female  has a past medical history of Osteoarthritis; Low back pain; Degenerative disc disease; Spinal stenosis; and Arthritis. here with RIGHT V1 POST-HERPETIC NEURALGIA. Some symptoms indicating right CN 7 and CN 8 involvement as well. She has received course of valtrex at onset. Overall symptoms are gradually improving. However patient continues to have intermittent episodes of anxiety and pain. I will try her on Cymbalta to see if this helps her neuropathic pain and anxiety symptoms. Symptoms may take a long time to recover, but I encouraged her to stay optimistic.   Suanne Marker, MD 04/01/2012, 10:10 AM Certified in Neurology, Neurophysiology and Neuroimaging  [facility] [address], [address] [place], [place] [address] [phone]

## 2012-07-10 ENCOUNTER — Telehealth: Payer: Self-pay | Admitting: Diagnostic Neuroimaging

## 2012-08-20 ENCOUNTER — Ambulatory Visit: Payer: 59 | Admitting: Diagnostic Neuroimaging

## 2012-11-28 ENCOUNTER — Other Ambulatory Visit: Payer: Self-pay | Admitting: Dermatology

## 2013-01-16 ENCOUNTER — Other Ambulatory Visit: Payer: Self-pay

## 2013-01-16 DIAGNOSIS — Z1231 Encounter for screening mammogram for malignant neoplasm of breast: Secondary | ICD-10-CM

## 2013-02-10 ENCOUNTER — Ambulatory Visit: Payer: Commercial Managed Care - PPO

## 2013-02-26 ENCOUNTER — Ambulatory Visit
Admission: RE | Admit: 2013-02-26 | Discharge: 2013-02-26 | Disposition: A | Discharge status: Home / Self Care | Payer: Medicare Other | Source: Ambulatory Visit

## 2013-02-26 DIAGNOSIS — Z1231 Encounter for screening mammogram for malignant neoplasm of breast: Secondary | ICD-10-CM

## 2013-03-02 ENCOUNTER — Other Ambulatory Visit: Payer: Self-pay | Admitting: Family Medicine

## 2013-03-02 DIAGNOSIS — R928 Other abnormal and inconclusive findings on diagnostic imaging of breast: Secondary | ICD-10-CM

## 2013-03-06 ENCOUNTER — Other Ambulatory Visit: Payer: Medicare Other

## 2013-03-06 ENCOUNTER — Ambulatory Visit
Admission: RE | Admit: 2013-03-06 | Discharge: 2013-03-06 | Disposition: A | Discharge status: Home / Self Care | Payer: Medicare Other | Source: Ambulatory Visit | Attending: Family Medicine | Admitting: Family Medicine

## 2013-03-06 DIAGNOSIS — R928 Other abnormal and inconclusive findings on diagnostic imaging of breast: Secondary | ICD-10-CM

## 2013-03-16 ENCOUNTER — Other Ambulatory Visit: Payer: Medicare Other

## 2013-03-18 NOTE — Telephone Encounter (Signed)
Pt did not come in for her f/u closing encounter

## 2023-08-10 NOTE — Discharge Summary (Signed)
 ------------------------------------------------------------------------------- Attestation signed by Rea Deanie Rickers, MD at 08/11/2023  3:43 PM I discussed the patient with the APP and reviewed this note. I agree with the APP's assessment and plan.   -------------------------------------------------------------------------------  Hospitalist  Discharge Summary   Name: [name] Age: 83 yrs  MRN: [id number] DOB: 07-Jun-1940  Admit Date: 08/08/2023 Admitting Physician: Toribio Maple Ramsay, MD  Discharge Date: 08/10/2023 Discharge Physician: No att. providers found/Keri Schuyler Admire, ACNP   Admission Diagnosis:   Pyelonephritis [N12] Acute pyelonephritis [N10] Chronic midline low back pain without sciatica [M54.50, G89.29]   Discharge Diagnoses:   Principal Problem (Resolved):   Pyelonephritis Active Problems:   Closed compression fracture of L1 lumbar vertebra, initial encounter    (CMD)   TO DO List at Follow-up for PCP/Specialist:   Key Medication changes:  No medication changes or prescriptions at discharge Continue all other home medications as outlined below Pending labs to follow up on: None Incidental findings requiring follow-up: None Other:  Clear for mobility with Jewett Brace when sitting in chair and ambulating.  Placed on spine precautions: Log rolls only, head of bed <30 Follow up in Orthopedic Spine clinic in 2-3 weeks  Follow-up with outpatient neurology for pathology results and repeat CT as previously planned  Indication for Hospitalization/Hospital Course:   For the details of admission, please see the H&P on 08/08/2023.  For full details, please see progress notes, consult notes and ancillary notes.  The patient's hospital course will be summarized in a problem based approach below.   Acute Pyelonephritis (resolved) Sepsis secondary to UTI  (resolved) Oral Candida (resolved) Sepsis as evidence by 102.9, lactic acidosis, hypotension, and  leukocytosis.S/p cystoscopy 7/30 with urology. Presented to ED 7/31 with leukocytosis to 13.4 w/ predominant neutrophilia. UA showed WBC, blood, rare bacteria + leuk esterase. CT Abdomen and Pelvis showed no urolithiasis or obstructive uropathy.  Urine culture was negative.  Blood cultures negative.  Surgical urine culture from 7/30 also negative. Leukocytosis resolved and trended down from 12.80-4.60.  She was afebrile and no leukocytosis.  Denied dysuria, hematuria, flank pain or fevers. Patient received ceftriaxone  x 3 days while inpatient.  She had no further complaints and was discharged home with strict return to the ED instructions should she become febrile, flank pain, hypotension, urinary retention, dysuria or hematuria.  Patient complained of some oral Candida and she was prescribed nystatin suspension and received 3 doses prior to discharge.  I offered to prescribe some nystatin at discharge and patient states that she did not want any at discharge because she had some at home as well as Magic mouthwash.  She was denying any thrush at the time of discharge.  Patient was discharged home with hemodynamically stable, voiding without issues, and asymptomatic.  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 Weight graph shows unintentional weight loss over the past 6 months. CT Abdomen and Pelvis showed irregularity of the bladder wall and underwent Cystoscopy with TURBT + Gemcitabine on 7/30. 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 Patient has plans to follow up with outpatient urology for pathology results and states she has an appointment with a repeat CT   L1 vertebral body compression fracture Ct Abdomen and Pelvis showed new L1 compression fracture and was evaluated by Ortho spine. This correlated with point tenderness on her physical exam. Orthospine was consulted and reviewed all imaging.  Brace education was offered and performed by  orthospine.  Patient was instructed to maintain Jewett brace intact when  patient sitting in chair and ambulating. May apply with patient sitting at edge of bed. Encourage logrolling. No twisting, bending or lifting greater than 10 pounds. Spine injury and brace education given verbally to patient. Patient understands importance of wearing brace due to injury. Recommend physical and occupational therapy for further brace teaching. Education sheet placed in discharge paperwork. Orthospine arranging outpatient clinic follow-up in 2-3 weeks  The patient's chronic medical conditions were treated accordingly per the patient's home medication regimen except as noted in the plan above and in the medication list below.    Discharge Condition:   Disposition: Patient discharged to Home or Self Care in stable condition.  Diet at discharge: Adult Diet- Regular Ensure Plus; Twice daily with Breakfast/Lunch  Activity at Discharge: Patient was instructed to maintain Jewett brace intact when patient sitting in chair and ambulating. May apply with patient sitting at edge of bed. Encourage logrolling. No twisting, bending or lifting greater than 10 pounds. Spine injury and brace education given verbally to patient. Patient understands importance of wearing brace due to injury. Recommend physical and occupational therapy for further brace teaching. Education sheet placed in discharge paperwork.    Wound 08/08/23 Skin Tear Tibial Left;Posterior (Active)  Date First Assessed/Time First Assessed: 08/08/23 2019   Pre-Existing Wound: Yes  Primary Wound Type: Skin Tear  Location: Tibial  Wound Location Orientation: Left;Posterior     Physical Exam at Discharge   BP (!) 150/90 (BP Location: Right arm, Patient Position: Lying)   Pulse 72   Temp 98.3 F (36.8 C) (Oral)   Resp 18   Ht 1.651 m (5' 5)   Wt 66 kg (145 lb 8.1 oz)   SpO2 97%   BMI 24.21 kg/m  Physical Exam:  General: AOx 3. Not Frail. Appears to be   stated age. No acute distress. HEENT: Normocephalic/ atraumatic, Ears normal Lungs: On room air. Respirations unlabored. Clear to auscultation bilaterally, no wheezes, rhonchi or rales.  Heart: Regular rate and rhythm, no murmur, click, rub or gallop  Abdomen: Soft, nondistended. Bowel sounds active. Nontender to palpation. Extremities: No pedal edema. 2+ distal pulses bilaterally. Skin: Warm and dry. No rash or ulcerations on exposed arms or legs. Neuro: No focal deficits. No tremor.   Discharge Medications:      Medication List     PAUSE taking these medications    minoxidiL 2.5 mg tablet Wait to take this until your doctor or other care provider tells you to start again. Commonly known as: LONITEN Take 0.5 tablets (1.25 mg total) by mouth daily.       CHANGE how you take these medications    ALPRAZolam  0.25 mg tablet Commonly known as: XANAX  Take 0.5 tablets (0.125 mg total) by mouth nightly as needed for sleep. What changed:  medication strength See the new instructions.   hydrocortisone 2.5 % cream Apply 1 Dose topically daily as needed (itching or inflamation). What changed: See the new instructions.       CONTINUE taking these medications    ascorbic acid 500 mg tablet Commonly known as: VITAMIN C Take 500 mg by mouth daily.   B-complex with vitamin C tablet Take 1 tablet by mouth daily.   clotrimazole 10 mg troche Commonly known as: MYCELEX Take 1 tablet (10 mg total) by mouth 3 (three) times a day for 10 days.   co-enzyme Q-10 30 mg capsule Take 200 mg by mouth daily.   magnesium oxide 400 mg (241 mg magnesium) Tab Take 400 mg  by mouth daily.   nystatin + lidocaine  2% + diphenhydramine suspension Commonly known as: MAGIC MOUTHWASH WITH LIDOCAINE  Swish 5ml in the mouth for 3 minutes 4 times daily and spit out   omega 3-dha-epa-fish oil 1,000 mg capsule Commonly known as: OMEGA 3 Take 1 g by mouth daily.   pyridoxine 50 mg tablet Commonly  known as: VITAMIN B6 Take 50 mg by mouth daily.   Systane (PF) 0.4-0.3 % Dpet Generic drug: peg 400-propylene glycol (PF) Administer 1 drop into the right eye 6 (six) times a day.   traMADoL  50 mg tablet Commonly known as: ULTRAM  TAKE 0.5 TABLETS (25 MG TOTAL) BY MOUTH EVERY 8 (EIGHT) HOURS AS NEEDED FOR MODERATE PAIN (4-6).   triamcinolone acetonide 0.1 % cream Commonly known as: KENALOG Apply topically 2 (two) times a day. To itchy areas . Avoid face.   valACYclovir  1 gram tablet Commonly known as: VALTREX  Take 1 tablet (1,000 mg total) by mouth daily Indications: shingles.         Where to Get Your Medications     Information about where to get these medications is not yet available   Ask your nurse or doctor about these medications ALPRAZolam  0.25 mg tablet hydrocortisone 2.5 % cream     Significant Diagnostic Tests:   LABS:  Lab Results  Component Value Date   WBC 2.70 (L) 08/10/2023   HGB 14.2 08/10/2023   HCT 42.0 08/10/2023   PLT 124 (L) 08/10/2023   CHOL 227 (H) 02/22/2023   TRIG 78 02/22/2023   HDL 61 02/22/2023   LDLDIRECT 138 (H) 01/24/2022   ALT 21 08/07/2023   AST 21 08/07/2023   NA 137 08/10/2023   K 4.0 08/10/2023   CL 106 08/10/2023   CREATININE 0.58 (L) 08/10/2023   BUN 10 08/10/2023   CO2 23 08/10/2023   HGBA1C 5.6 02/22/2023   IMAGING:  Recent Results (from the past 750 hours)  CT Abdomen Pelvis WO Contrast   Collection Time: 08/08/23  5:45 AM   Impression   Within the limitation of noncontrast technique: 1.  No definite acute findings. 2.  No radiopaque urolithiasis or obstructive uropathy. 3.  Irregularity of the bladder wall, better assessed on prior contrast-enhanced examinations. Intraluminal air is likely related to recent cystoscopy. 4.  Interval new L1 vertebral body compression deformity. Correlate with point tenderness.  XR Spine Lumbar 2-3 Views   Collection Time: 08/08/23  2:33 PM   Impression   1.  Age-indeterminate  compression fracture of L1 with approximately 30% height loss. No interval change in alignment or height loss on these erect images compared to the same day CT. 2.  No new fractures. 3.  Levoconvex curvature of the lumbar spine. 4.  Grade 1 anterolisthesis of L4 on L5 and L5 on S1. 5.  Multilevel degenerative disc disease, moderate at levels L2-L5, and moderate to severe at L5-S1. 6.  Severe lower lumbar facet arthropathy.   CULTURES:  Results for orders placed or performed during the hospital encounter of 08/08/23 (from the past week)  Blood Culture, 1st Set   Specimen: Venous; Blood  Result Value Ref Range   Blood Culture No growth after 2 days.   Blood Culture, 2nd Set   Specimen: Venous; Blood  Result Value Ref Range   Blood Culture No growth after 2 days.   Urine Culture   Specimen: Clean Catch; Urine  Result Value Ref Range   Urine Culture No Growth      Consults:  IP CONSULT TO ORTHOPEDIC SURGERY IP CONSULT TO SPIRITUAL CARE   Follow-up Appointments:     Future Appointments  Date Time Provider Department Center  08/13/2023 10:30 AM [id number] KV CT [facility] [id number]  09/12/2023 10:00 AM John Taft L[facility] OPT JT [facility] [place]  09/25/2023  1:00 PM Zhongyu Norleen Cowing, MD [facility] ORT MPM [facility] [place]  10/16/2023 10:20 AM Donnice Sickles, MD [facility] OPH ELM None  12/11/2023 10:45 AM Zeynep Maurita Ahumada, MD [facility] GEN DE WFB [address]  03/18/2024  1:20 PM Reyes Alm Blunt, MD [facility] IM CC WFB CC WS       Electronically signed by: Levorn Schuyler Admire, ACNP 08/10/2023 3:15 PM Time spent on discharge: 40 minutes.  Portions of this note were dictated using Animal nutritionist.  It has been reviewed for accuracy, but may contain grammatical and clerical errors.

## 2023-08-24 NOTE — ED Provider Notes (Signed)
 Emergency Department Provider Note     Provider at bedside: 11:12 AM  History obtained from the: Patient  History   Chief Complaint  Patient presents with  . Arm Pain     Patient complains of pain in her right arm.  She states he was in the hospital 2 weeks ago for pyelonephritis.  After getting home, she developed some pain around the right wrist and right forearm a couple of days after being home.  She also complained of some pain in her right upper arm and the back of the right upper arm.  Both of these pains got worse a couple of days ago.  She has not had any fever or chills.  She thinks the right upper arm might have been swollen.  She denies any redness or heat to the arm.  She denies any other complaints.  She does not take any blood thinners.  She did have an injury to the arm where she bumped into a door frame and treated it with antibiotic ointment.      No LMP recorded. Patient is postmenopausal.   Past Medical History Medical History[1]  Past Surgical History Surgical History[2]   Medications Prior to Admission medications  Medication Sig Start Date End Date Taking? Authorizing Provider  ALPRAZolam  (XANAX ) 0.25 mg tablet Take 0.5 tablets (0.125 mg total) by mouth nightly as needed for sleep. 08/10/23   Levorn Schuyler Admire, ACNP  ascorbic acid (VITAMIN C) 500 mg tablet Take 500 mg by mouth daily. 08/19/19   HISTORICAL PROVIDER, CONVERSION  B complex with minerals, vitamin c, iron and folic acid (HEMATINIC PLUS) 106 mg iron- 1 mg tab tablet Take 1 tablet by mouth daily.    HISTORICAL PROVIDER, CONVERSION  B-complex with vitamin C tablet Take 1 tablet by mouth daily.    HISTORICAL PROVIDER, CONVERSION  co-enzyme Q-10 capsule Take 200 mg by mouth daily. 08/19/19   HISTORICAL PROVIDER, CONVERSION  hydrocortisone 2.5 % cream Apply 1 Dose topically daily as needed (itching or inflamation). 08/10/23 11/08/23  Levorn Schuyler Admire, ACNP  magnesium oxide 400 mg (241 mg magnesium)  tab Take 400 mg by mouth daily.    HISTORICAL PROVIDER, CONVERSION  [Paused] minoxidiL (LONITEN) 2.5 mg tablet Take 0.5 tablets (1.25 mg total) by mouth daily. Wait to take this until your doctor or other care provider tells you to start again. 06/24/23 12/21/23  Zeynep Meltem Akkurt, MD  nystatin + lidocaine  2% + diphenhydramine (MAGIC MOUTHWASH WITH LIDOCAINE ) suspension Swish 5ml in the mouth for 3 minutes 4 times daily and spit out 08/01/23   Zeynep Meltem Akkurt, MD  omega 3-dha-epa-fish oil (OMEGA 3) 1,000 mg capsule Take 1 g by mouth daily. 08/19/19   HISTORICAL PROVIDER, CONVERSION  peg 400-propylene glycol, PF, (Systane, PF,) 0.4-0.3 % dpet Administer 1 drop into the right eye 6 (six) times a day. 06/04/14   HISTORICAL PROVIDER, CONVERSION  pyridoxine (VITAMIN B6) 50 mg tablet Take 50 mg by mouth daily. Patient not taking: Reported on 08/24/2023    HISTORICAL PROVIDER, CONVERSION  traMADoL  (ULTRAM ) 50 mg tablet TAKE 0.5 TABLETS (25 MG TOTAL) BY MOUTH EVERY 8 (EIGHT) HOURS AS NEEDED FOR MODERATE PAIN (4-6). 05/23/23   Reyes Alm Blunt, MD  triamcinolone (KENALOG) 0.1 % cream Apply topically 2 (two) times a day. To itchy areas . Avoid face. 05/23/22   Zeynep Meltem Akkurt, MD  valACYclovir  (VALTREX ) 1 gram tablet Take 1 tablet (1,000 mg total) by mouth daily Indications: shingles. 04/10/23   Donnice Sickles,  MD     Allergies Allergies[3]   Family History Family History[4]   Social History Social History[5]   Review of Systems  Review of Systems  Constitutional:  Negative for chills and fever.  Respiratory:  Negative for shortness of breath.   Cardiovascular:  Negative for chest pain.  Gastrointestinal:  Negative for abdominal pain, diarrhea, nausea and vomiting.  Musculoskeletal:        Right arm pain     Physical Exam   Vitals:   08/24/23 0932 08/24/23 1100 08/24/23 1200  BP: (!) 162/67 (!) 146/53   BP Location: Left arm    Patient Position: Sitting    Pulse: 72  68 67  Resp: 18 18   Temp: 98 F (36.7 C)    TempSrc: Oral    SpO2: 98% 98% 96%  Weight: 60.4 kg (133 lb 3.2 oz)    Height: 165.1 cm (5' 5)      Physical Exam Vitals and nursing note reviewed.  Constitutional:      Appearance: Normal appearance.  HENT:     Head: Normocephalic and atraumatic.     Right Ear: External ear normal.     Left Ear: External ear normal.     Mouth/Throat:     Mouth: Mucous membranes are moist.     Pharynx: Oropharynx is clear.   Cardiovascular:     Rate and Rhythm: Normal rate and regular rhythm.     Pulses: Normal pulses.     Heart sounds: Normal heart sounds.  Pulmonary:     Effort: Pulmonary effort is normal.     Breath sounds: Normal breath sounds.  Abdominal:     Palpations: Abdomen is soft.   Musculoskeletal:     Comments: There is mild tenderness palpation of the back of the right arm.  There is no erythema or warmth associated with it.  There is no swelling in the distal right arm.  She is neurovascularly intact.   Skin:    General: Skin is warm and dry.     Capillary Refill: Capillary refill takes less than 2 seconds.   Neurological:     General: No focal deficit present.     Mental Status: She is alert and oriented to person, place, and time.   Psychiatric:        Mood and Affect: Mood normal.        Behavior: Behavior normal.        Thought Content: Thought content normal.      Previous Chart Review   Reviewed patient's discharge from 08/10/2023 when she was admitted for pyelonephritis.  Differential Diagnosis   DVT, cellulitis, deep infection, muscle strain, radiculopathy.  Cardiac Monitor Interpretation   Sinus at 72, 98% sats.  EKG Interpretation and HEAR Score   None      Initial Radiology Interpretation & Consideration   None  Radiology reads of all radiologic studies have been reviewed.  Lab Interpretation   CBC and CMP are normal.   Interventions   None  Procedure Note   Procedures   Consults   None  MDM   ED Course as of 08/24/23 1408  Sat Aug 24, 2023  1406 Patient presented with right arm pain following recent hospitalization and IVs in that arm.  Ultrasound does not show any DVT.  Patient relates that after thinking about it she realized that she was in a brace after leaving the hospital and used her right arm to push off the bed a lot to  get up so that might of caused the pain.  There is no erythema or warmth in the arm so I have a low suspicion for any kind of cellulitis or deep infection.  I think she is safe for discharge to home. [JW]    ED Course User Index [JW] Dorn Arloa Hoover, MD     Clinical Complexity  Patient's presentation is most consistent with acute presentation with potential threat to life or bodily function.  Provider time spent in patient care today, inclusive of but not limited to clinical reassessment, review of diagnostic studies, and discharge preparation, was greater than 30 minutes.   ED Clinical Impression   1. Right arm pain Acute    ED Disposition   Patient to be discharged to home.  New Prescriptions   No medications on file    FOLLOW UP Reyes Alm Blunt, MD [address] [place] [address] [phone]  Schedule an appointment as soon as possible for a visit in 3 days For follow up  [facility] -  EMERGENCY DEPARTMENT [address] [place]  [address] [phone] Go to  As needed    _______________________________________________________________________        [1] Past Medical History: Diagnosis Date  . ANA positive 11/09/2013  . Anxiety    prior rx - sertraline (fatigue)  . Bilateral carpal tunnel syndrome 05/07/2022  . Bladder cancer    (CMD)   . Corneal edema 08/01/2012  . 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 Essential  hypertension 11/08/2013  . Generalized osteoarthritis of multiple sites    tramadol  prn   . Hearing loss   . History of basal cell cancer 01/17/2015  . History of shingles   . 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 Lumbar disc disease   . Nuclear sclerosis, bilateral 06/10/2013  . OSA (obstructive sleep apnea) 06/02/2014   intolerant to CPAP (dry mouth and dry eye)  . PONV (postoperative nausea and vomiting)    only with general anesthesia  . Pseudophakia of right eye 12/17/2013   Alcon, model [id number], 15.0 diopters, SN # [id number]  . Scleritis and episcleritis 08/01/2012  . Shingles   . Snoring   . Stroke    (CMD)    08/2018 at [facility] - MRI .   SABRA Varicella   . Visual impairment 2014   Shingles  [2] Past Surgical History: Procedure Laterality Date  . APPENDECTOMY     Procedure: APPENDECTOMY  . BLADDER NECK SUSPENSION     Procedure: BLADDER NECK SUSPENSION  . BREAST BIOPSY Right    Procedure: BREAST BIOPSY  . CATARACT EXTRACTION Right 12/23/2015   Procedure: YAG CAPSULOTOMY  . CATARACT EXTRACTION     Procedure: CATARACT EXTRACTION  . CATARACT EXTRACTION W/  INTRAOCULAR LENS IMPLANT Right 12/17/2013   Procedure: PHACOEMULSIFICATION PC / IOL TOPICAL;  Surgeon: Donnice Sickles, MD;  Location: [facility] OUTPATIENT OR;  Service: Ophthalmology;  Laterality: Right;  . CATARACT EXTRACTION W/  INTRAOCULAR LENS IMPLANT Left 09/03/2017   Procedure: PHACOEMULSIFICATION PC / IOL TOPICAL;  Surgeon: Donnice Sickles, MD;  Location: [facility] MAIN OR;  Service: Ophthalmology;  Laterality: Left;  . DEBRIDEMENT TENNIS ELBOW Bilateral    Procedure: DEBRIDEMENT TENNIS ELBOW  . FOOT NEUROMA SURGERY Bilateral    Procedure: FOOT NEUROMA SURGERY  . HYSTERECTOMY      Procedure: HYSTERECTOMY  . LUMBAR LAMINECTOMY  Procedure: LUMBAR LAMINECTOMY  . OTHER SURGICAL HISTORY Left    Procedure: OTHER SURGICAL HISTORY (THUMB JOINT)  . OTHER SURGICAL HISTORY     Procedure: OTHER SURGICAL HISTORY  (skin caner removal); From nose  . SKIN BIOPSY     Procedure: SKIN BIOPSY  . SPINE SURGERY  2004?  [3] Allergies Allergen Reactions  . Adhesive Dermatitis    Patient said she had a break out from tape on electrodes placed on chest.  . Amitriptyline Other (See Comments)    dryness  . Cephalexin Diarrhea    Bloody diarrhea  . Codeine Dermatitis  . Duloxetine  Mental Status Changes  . Rosuvastatin Myalgias    Trial s/p stroke in 2020   . Amoxicillin Rash    Also caused hematuria  . Gabapentin Angioedema  [4] Family History Problem Relation Name Age of Onset  . Cancer Father toccara alford        lung  . Cancer Mother May Manrique        breast, colon  . Cataracts Mother Sharlena Kristensen   . Hypertension Mother Zanayah Shadowens   . Arthritis Mother Laquashia Mergenthaler   . Heart failure Mother Dawnisha Marquina        age 22  . Hyperlipidemia Mother Yocelin Vanlue   . Breast cancer Mother Charnele Semple   . Cancer Paternal Uncle 2 UNCLES   . Heart failure Maternal Grandmother         age 32  . Heart failure Maternal Grandfather    . Cancer Brother Ubaldo sawyers  . Rheum arthritis Neg Hx    . Lupus Neg Hx    . Inflammatory bowel disease Neg Hx    . Psoriasis Neg Hx    . Eczema Neg Hx    [5] Social History Tobacco Use  . Smoking status: Former    Current packs/day: 0.00    Types: Cigarettes    Quit date: 04/17/1988    Years since quitting: 35.3  . Smokeless tobacco: Never  Vaping Use  . Vaping status: Never Used  Substance Use Topics  . Alcohol use: Yes  . Drug use: No

## 2023-08-29 NOTE — H&P (Signed)
 Urology History and Physical  Subjective: Patient is a 83 y.o. female with a history of high grade urothelial carcinoma.   They have seen Dr. Vannie in consultation and would like to proceed with cystoscopy with transurethral resection of bladder tumor.   No new medical history, medicines, or recent illnesses noted.    Urine culture results reviewed. Discussed with attending physician urine culture results  Lab Results  Component Value Date/Time   URINECX No Growth 08/08/2023 08:05 AM   URINECX No Growth 08/07/2023 10:17 AM     Medical History[1]  Surgical History[2]  Prescriptions Prior to Admission[3] Allergies[4]     Family History[5]   Review of Systems Constitutional symptoms: Denies fevers, chills Eyes:  Denies changes in vision  Ear, nose, throat:  Denies sore throat, headaches Cardiovascular: Denies chest pain, palpitations Respiratory:  Denies SOB Gastrointestinal:  Denies nausea, vomiting diarrhea Genitourinary:  Denies hematuria Skin:  Denies skin changes Neurological:  Denies changes in sensation or motor function Musculoskeletal:  Denies joint pain issues Psychiatric:  Negative Endocrine:  Negative Hematological: Negative Allergic:  Negative  Objective: Vital signs in last 24 hours:    General: Awake, in no apparent distress Chest: Equal chest rise bilaterally, no increased work of breathing Cardiac: Hemodynamically stable Extremities: extremities warm and without acute gross deformities Neurological: No gross deficits   Assessment: 83 y.o. female with history as above here for surgical management.  Plan:  To OR for TURBT with Dr. Vannie The diagnoses, recommended procedures, and plan of care was once again discussed in detail with family. All risks, benefits, and alternatives of the procedure were clearly elucidated. All questions were solicited and answered to their satisfaction.  Informed consent was obtained.  Patient and plan of care discussed  with Hulda Margrette Vannie, * who is in agreement.   Garnette Aleene Mound, MD         [1] Past Medical History: Diagnosis Date  . ANA positive 11/09/2013  . Anxiety    prior rx - sertraline (fatigue)  . Bilateral carpal tunnel syndrome 05/07/2022  . Bladder cancer    (CMD)   . Corneal edema 08/01/2012  . 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 Essential hypertension 11/08/2013  . Generalized osteoarthritis of multiple sites    tramadol  prn   . Hearing loss   . History of basal cell cancer 01/17/2015  . History of shingles   . 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 Lumbar disc disease   . Nuclear sclerosis, bilateral 06/10/2013  . OSA (obstructive sleep apnea) 06/02/2014   intolerant to CPAP (dry mouth and dry eye)  . PONV (postoperative nausea and vomiting)    only with general anesthesia  . Pseudophakia of right eye 12/17/2013   Alcon, model [id number], 15.0 diopters, SN # [id number]  . Scleritis and episcleritis 08/01/2012  . Shingles   . Snoring   . Stroke    (CMD)    08/2018 at [facility] - MRI .   SABRA Varicella   . Visual impairment 2014   Shingles  [2] Past Surgical History: Procedure Laterality Date  . APPENDECTOMY     Procedure: APPENDECTOMY  . BLADDER NECK SUSPENSION     Procedure: BLADDER NECK SUSPENSION  . BREAST BIOPSY Right    Procedure: BREAST BIOPSY  . CATARACT EXTRACTION Right 12/23/2015   Procedure: YAG CAPSULOTOMY  . CATARACT EXTRACTION     Procedure: CATARACT EXTRACTION  . CATARACT EXTRACTION  W/  INTRAOCULAR LENS IMPLANT Right 12/17/2013   Procedure: PHACOEMULSIFICATION PC / IOL TOPICAL;  Surgeon: Donnice Sickles, MD;  [facility];  Service: Ophthalmology;  Laterality: Right;  . CATARACT EXTRACTION W/  INTRAOCULAR LENS IMPLANT Left 09/03/2017   Procedure: PHACOEMULSIFICATION PC / IOL TOPICAL;  Surgeon: Donnice Sickles, MD;  Location: [facility] MAIN OR;  Service:  Ophthalmology;  Laterality: Left;  . DEBRIDEMENT TENNIS ELBOW Bilateral    Procedure: DEBRIDEMENT TENNIS ELBOW  . FOOT NEUROMA SURGERY Bilateral    Procedure: FOOT NEUROMA SURGERY  . HYSTERECTOMY      Procedure: HYSTERECTOMY  . LUMBAR LAMINECTOMY     Procedure: LUMBAR LAMINECTOMY  . OTHER SURGICAL HISTORY Left    Procedure: OTHER SURGICAL HISTORY (THUMB JOINT)  . OTHER SURGICAL HISTORY     Procedure: OTHER SURGICAL HISTORY (skin caner removal); From nose  . SKIN BIOPSY     Procedure: SKIN BIOPSY  . SPINE SURGERY  2004?  [3] No medications prior to admission.  [4] Allergies Allergen Reactions  . Adhesive Dermatitis    Patient said she had a break out from tape on electrodes placed on chest.  . Amitriptyline Other (See Comments)    dryness  . Cephalexin Diarrhea    Bloody diarrhea  . Codeine Dermatitis  . Duloxetine  Mental Status Changes  . Rosuvastatin Myalgias    Trial s/p stroke in 2020   . Amoxicillin Rash    Also caused hematuria  . Gabapentin Angioedema  [5] Family History Problem Relation Name Age of Onset  . Cancer Father misao fackrell        lung  . Cancer Mother Laretha Luepke        breast, colon  . Cataracts Mother Znya Albino   . Hypertension Mother Marlisa Caridi   . Arthritis Mother Kelseigh Diver   . Heart failure Mother Oneida Mckamey        age 24  . Hyperlipidemia Mother Mizuki Hoel   . Breast cancer Mother Alazne Quant   . Cancer Paternal Uncle 2 UNCLES   . Heart failure Maternal Grandmother         age 48  . Heart failure Maternal Grandfather    . Cancer Brother Ubaldo sawyers  . Rheum arthritis Neg Hx    . Lupus Neg Hx    . Inflammatory bowel disease Neg Hx    . Psoriasis Neg Hx    . Eczema Neg Hx

## 2023-08-29 NOTE — Discharge Summary (Signed)
 ------------------------------------------------------------------------------- Attestation signed by Hulda Margrette Finder, MD at 09/02/2023  1:10 PM Attending Attestation I personally reviewed the hospital course, independently interpreted pertinent labs and imaging, and coordinated care with consultants and the primary team. I engaged in shared decision-making with the patient/family regarding discharge readiness, follow-up, and treatment plan. I determined the medical necessity of discharge at this time and finalized the discharge plan. I have reviewed and verified the resident's discharge summary, and I agree with the findings, hospital course, and plan as documented.  Hulda Finder, MD 09/02/2023 1:10 PM -------------------------------------------------------------------------------  Urology Discharge Summary  Patient ID: [name] [id number] 83 y.o. DOB: 03/15/1940  Admit date: 08/29/2023 Admitting Physician: Hulda Margrette Finder, MD Admission Diagnoses:  Gross hematuria [R31.0] Bladder tumor [D49.4]  Discharge date: 08/30/2023  Discharge Physician: Hulda Margrette Finder, * Discharge Diagnoses: Gross hematuria [R31.0] Bladder tumor [D49.4]  Admission Condition: fair Discharged Condition: good  Indication for Admission: Post-operative recovery  Hospital Course:  [name] is a 83 y.o. female admitted to the hospital for recovery following Procedure(s) (LRB): CYSTOSCOPY TRANSURETHRAL RESECTION BLADDER TUMOR DEEP, BLADDER STONE REMOVAL (N/A) performed on 08/29/2023 by Dr. Hulda Margrette Finder, MD. The patient tolerated the procedure well, and there were no immediate postoperative complications (see operative report for full details).  The patient was brought to the floor after surgery following uneventful recovery in the PACU.  The patient's foley catheter was left in place at discharge.  The patients post-operative course was uncomplicated. They were discharged on  POD 1.    At the time of discharge, the patient is afebrile and hemodynamically stable. Pain is well controlled with oral pain medications. The patient is tolerating a general diet without nausea/vomiting, passing flatus, and ambulating without difficulty at their baseline status. The patient has met all goals necessary for discharge from a medical and surgical standpoint and is considered stable and suitable for discharge. Discharge medications were discussed in detail and the patient stated understanding of use and administration. Instructions for appropriate wound care were also discussed. The patient verbalized understanding of all discharge instructions and therefore was released.   Patients follow up plan is listed below. - Foley removal Tuesday 8/26  - Follow up with Dr. Finder on Thursday 8/28 for pathology   Consults:  None   Discharge Exam: General: awake, alert, no acute distress HEENT: normocephalic, atraumatic Cardiovascular: Hemodynamically stable Pulmonary: normal work of breathing. No accessory muscle use. Abdomen: soft, non-distended, non-tender Extremities: no deformities, grossly normal range of motion throughout Neurologic: Grossly oriented to baseline, no obvious focal deficits GU: foley catheter draining yellow urine   Disposition: home  Discharge Medications:   Medication List     ASK your doctor about these medications    * ALPRAZolam  0.5 mg tablet Commonly known as: XANAX  Take 0.125-0.25 mg by mouth daily as needed for anxiety.   * ALPRAZolam  0.25 mg tablet Commonly known as: XANAX  Take 0.5 tablets (0.125 mg total) by mouth nightly as needed for sleep.   ascorbic acid 500 mg tablet Commonly known as: VITAMIN C Take 500 mg by mouth daily.   B COMPLEX ORAL Take 1 tablet by mouth daily.   clotrimazole 10 mg troche Commonly known as: MYCELEX Take 10 mg by mouth 3 (three) times a day as needed.   co-enzyme Q-10 30 mg capsule Take 200 mg by mouth  daily.   hydrocortisone 2.5 % cream Apply 1 Dose topically daily as needed (itching or inflamation).   magnesium oxide 400 mg (241  mg magnesium) Tab Take 400 mg by mouth daily.   minoxidiL 2.5 mg tablet Wait to take this until your doctor or other care provider tells you to start again. Commonly known as: LONITEN Take 0.5 tablets (1.25 mg total) by mouth daily.   nystatin + lidocaine  2% + diphenhydramine suspension Commonly known as: MAGIC MOUTHWASH WITH LIDOCAINE  Swish 5ml in the mouth for 3 minutes 4 times daily and spit out   omega 3-dha-epa-fish oil 1,000 mg capsule Commonly known as: OMEGA 3 Take 1 g by mouth daily.   polyethylene glycol 17 gram Powd powder Commonly known as: MIRALAX  Take 17 g by mouth daily as needed for constipation.   Systane (PF) 0.4-0.3 % Dpet Generic drug: peg 400-propylene glycol (PF) Administer 1 drop into the right eye 6 (six) times a day.   traMADoL  50 mg tablet Commonly known as: ULTRAM  TAKE 0.5 TABLETS (25 MG TOTAL) BY MOUTH EVERY 8 (EIGHT) HOURS AS NEEDED FOR MODERATE PAIN (4-6).   triamcinolone acetonide 0.1 % cream Commonly known as: KENALOG Apply topically 2 (two) times a day. To itchy areas . Avoid face.   valACYclovir  1 gram tablet Commonly known as: VALTREX  Take 1 tablet (1,000 mg total) by mouth daily Indications: shingles.      * * This list has 2 medication(s) that are the same as other medications prescribed for you. Read the directions carefully, and ask your doctor or other care provider to review them with you.           Follow-up Appointments: with Urology, to be scheduled and planned as below  Future Appointments  Date Time Provider Department Center  09/02/2023  3:45 PM Dorothyann JONELLE Holland, NP [facility] SPINE [facility] [address]  09/03/2023  9:30 AM Oluwatoyin Alaba Fadeyi, DNP [facility] URO CHA [address]  09/03/2023  2:00 PM Reyes Alm Blunt, MD [facility] IM CC WFB CC WS  09/05/2023 10:30 AM Colton Margrette Finder, MD [facility]  URO CHA [address]  09/23/2023  3:30 PM Deane ORN Donalds, PA-C [facility] ORT FRA None  09/25/2023  1:00 PM Zhongyu Norleen Cowing, MD [facility] ORT MPM [address]  10/16/2023 10:20 AM Donnice Sickles, MD [facility] OPH ELM None  12/11/2023 10:45 AM Zeynep Maurita Ahumada, MD [facility] GEN DE [id number]  03/18/2024  1:20 PM Reyes Alm Blunt, MD [facility] IM CC WFB CC WS   Time spent on discharge: 30 min  Julianne Bathe, MD General Surgery, PGY-1   *Some images could not be shown.

## 2023-09-05 NOTE — Progress Notes (Signed)
 [facility] Department of Urology Colton H. Vannie, MD  Patient Name: [name]  Referred by: Willadean Odis Hawthorne, MD  PCP: Reyes Alm Blunt, MD  Date of Visit: 09/05/2023  Chief Complaint(s): Bladder Cancer  HISTORY OF PRESENT ILLNESS [name] is a 83 y.o. 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 He has since passed away, and she is participating in grief counseling though her church. Latest updates: Has recovered well from her TURBT without recurrence of her gross hematuria. Has been voiding well since Foley catheter removal on 09/03/23.  ONCOLOGIC HISTORY - 09/29/20 CT Urogram: 2.4 cm left lateral wall bladder mass - 09/22/21 CT Urogram: 2.9 cm left lateral wall bladder mass with apparent local spread - 08/13/23 CT Urogram: Stable 2.9 cm left lateral wall bladder mass with local spread to include the anterior (2.1 cm) and posterior (1.2 cm) bladder walls - 08/29/23: TURBT (>5 cm) > HG Ta UCC  ONCOLOGIC RISK FACTORS Environmental exposures (e.g., military service): No Family history of GU or other cancers: Yes, see below Genetic syndromes (e.g., Lynch, BRCA, VHL): No History of prior malignancies: Yes, skin cancer Occupational exposures (e.g., chemicals, dyes, radiation): No Personal history of chronic inflammation/infection: No Prior GU or cancer-related surgeries: Yes, see below Radiation or chemotherapy history: No Smoking history: Yes, quit in 1990  OTHER PERTINENT NOTES She has had two prior incontinence procedures for SUI - first in the late 1990s, and most recently a midurethral  sling in the early 2000s. She is a retired engineer, civil (consulting).  PAST MEDICAL, SURGICAL, FAMILY, AND SOCIAL HISTORY Medical: Medical History[1] Surgical: Surgical History[2] Family: Family History[3] Social: Social History[4] Allergies: Allergies[5] Medications: Prior to Admission medications  Medication  acetaminophen  (TYLENOL ) 500 mg tablet  ALPRAZolam  (XANAX ) 0.25 mg tablet  ALPRAZolam  (XANAX ) 0.5 mg tablet  ascorbic acid (VITAMIN C) 500 mg tablet  clotrimazole (MYCELEX) 10 mg troche  co-enzyme Q-10 capsule  hydrocortisone 2.5 % cream  magnesium oxide 400 mg (241 mg magnesium) tab  [Paused] minoxidiL (LONITEN) 2.5 mg tablet  nystatin + lidocaine  2% + diphenhydramine (MAGIC MOUTHWASH WITH LIDOCAINE ) suspension  omega 3-dha-epa-fish oil (OMEGA 3) 1,000 mg capsule  peg 400-propylene glycol, PF, (Systane, PF,) 0.4-0.3 % dpet  polyethylene glycol (MIRALAX ) 17 gram powd powder  traMADoL  (ULTRAM ) 50 mg tablet  triamcinolone (KENALOG) 0.1 % cream  valACYclovir  (VALTREX ) 1 gram tablet  vitamin B complex (B COMPLEX ORAL)   VITALS AND BMI Vitals:   09/05/23 1030  BP: 134/61  Pulse: 71  Temp: 97.8 F (36.6 C)  SpO2: 98%   Body mass index is 22.13 kg/m.  PHYSICAL EXAM General: NAD, well-appearing Abdomen: No abdominal tenderness Genitourinary: No CVA, flank, or suprapubic tenderness Surgical Site(s): No surgical incisions/scars Lines/Drains: None  LABS, IMAGING, AND PATHOLOGY H/H: Lab Results  Component Value Date   WBC 7.41 08/24/2023   HGB 16.7 (H) 08/24/2023   HCT 49.1 (H) 08/24/2023   PLT 350 08/24/2023   Cr/eGFR: Lab Results  Component Value Date   CREATININE 0.63 08/24/2023   EGFR 89 08/24/2023   HbA1c:  Lab Results  Component Value Date   HGBA1C 5.6 02/22/2023   UA:  Lab Results  Component Value Date   COLORU Yellow 08/08/2023   CLARITYU Cloudy (A) 08/08/2023   SPECGRAV 1.021 08/08/2023   PHUR 6.0 08/08/2023   PROTEINUA 30 (A) 08/08/2023   GLUCOSEU  Negative 08/08/2023   KETONEU Negative 08/08/2023   BILIRUBINUR Negative 08/08/2023   BLOODU 3+ (A) 08/08/2023   UROBILINOGEN Normal 08/08/2023   LEUKOUA 250 (A) 08/08/2023   RBCU >20 (A) 08/08/2023   WBCU 41-50 (A) 08/08/2023   SQUAMEPICUA 0-5 08/08/2023   BACTERIA Rare 08/08/2023   HYALCASTSCUA 3-5 (A) 08/08/2023   Culture: No results found for the last 90 days.  Cytology: A. URINE (THINPREP):              Atypical cells, suspicious for high-grade urothelial carcinoma.   Pathology: Lab Results  Component Value Date   FINALDX  08/29/2023     A.  BLADDER, TUMOR, TRANSURETHRAL RESECTION: High-grade papillary urothelial carcinoma. No evidence of invasion. No flat urothelial carcinoma in situ. No muscularis propria.  B.  BLADDER, TUMOR DEEP, TRANSURETHRAL RESECTION: Detached cauterized fragments of high-grade papillary urothelial carcinoma. Fragments of muscularis propria and lamina propria, uninvolved by carcinoma.      Imaging: I personally reviewed all relevant labs and imaging, integrating their findings into my clinical assessment. My interpretations and their impact on decision-making are included in the HPI and plan above.  Radiology Results (last 30 days)     Procedure Component Value Units Date/Time   CT Urogram [[id number]] Collected: 08/13/23 1147   Order Status: Completed Updated: 08/13/23 1220   Narrative:     CT UROGRAM, 08/13/2023 11:20 AM  INDICATION: Bladder cancer, staging, Gross hematuria \ R31.0 Gross hematuria  COMPARISON: 08/08/2023 and 09/22/2021 CT abdomen and pelvis.  TECHNIQUE: CT images of the abdomen and pelvis were obtained prior to and after intravenous administration of iodinated contrast, per institution urogram protocol. Conventional axial reconstructions and multiplanar reformatted images were submitted for review.   FINDINGS:  . Lower Chest: Small hiatal hernia.  . Kidneys/Ureters: No hydronephrosis. No radiopaque stones. Irregular  thickening at the left UVJ at the site of the posterior bladder and possible thickening of the distal 1 cm of the left ureter is (6/161). Bilateral subcentimeter hypodensities are too small to characterize but statistically benign. . Bladder: Compared to 09/22/2021 urogram, - Left posterior mass measures 0.8 x 1.2 cm, previously 0.5 x 1.0 cm (4/160) - Left anterior mass measures 2.1 x 2.1 cm, likely new (4/167) - Left lateral wall thickening measures 1.0 x 2.9 cm, unchanged (4/157) Locule of gas in the urinary bladder, presumed due to recent instrumentation/catheterization. . Reproductive System: Hysterectomy.  . Adrenals: Unremarkable. . Retroperitoneum: No pathologically enlarged lymph nodes.  . Liver: Subcentimeter hypodensities are too small to characterize but are statistically benign. . Gallbladder/Biliary: Cholelithiasis. SABRA Spleen: Unremarkable. . Pancreas: Unremarkable.  . Peritoneum/Mesenteries: No free air. No free fluid or loculated drainable collection. No pathologically enlarged lymph nodes. . Gastrointestinal tract: No evidence of obstruction. Colonic diverticulosis. Prior appendectomy.  . Vascular: Mild aortobiiliac calcific atherosclerosis. . Musculoskeletal: Similar compression fracture at L1. Polyarticular degenerative changes. No aggressive focal bony lesions. Abdominal wall soft tissues unremarkable.     Impression:     Compared to 09/22/2021, 1.  New bladder mass, increased size of a prior bladder mass, and similar asymmetric focal bladder wall thickening are all concerning for urothelial malignancy.  2.  Thickening of the left UVJ and possible thickening of the distal left ureter is concerning for ureteral involvement. No left hydronephrosis.  3.  No evidence of distal metastatic disease.    CT Abdomen Pelvis WO Contrast [[id number]] Collected: 08/08/23 0612   Order Status: Completed Updated: 08/08/23 0946   Narrative:     CT ABDOMEN PELVIS WO CONTRAST, 08/08/2023  5:45 AM  INDICATION: UTI, recurrent/complicated (Female)  COMPARISON: CT 09/22/2021  TECHNIQUE: CT images of the abdomen and pelvis were obtained without the use of intravenous contrast. Conventional axial reconstructions and multiplanar reformatted images were submitted for review.   LIMITATIONS: Evaluation of various soft tissue structures as well as the vasculature is limited without the availability of intravenous contrast. Parameters utilized in some study protocols to mitigate patient radiation exposure can also reduce sensitivity and specificity of assessment.  FINDINGS:  . Lower Chest: Aortic root calcifications. Scattered subsegmental atelectasis/scarring. Small hiatal hernia.  . Liver: No suspicious focal findings. . Gallbladder/Biliary: Cholelithiasis without evidence of cholecystitis. SABRA Spleen: Unremarkable. . Pancreas: Unremarkable. . Adrenals: Unremarkable. . Right Kidney: Subcentimeter hypoattenuating lesions are better characterized on prior contrast-enhanced examinations. No apparent associated fluid collections. . Left Kidney: Subcentimeter hypoattenuating lesions are better characterized on prior contrast-enhanced examinations. No apparent associated fluid collections.  . Peritoneum/Mesenteries/Extraperitoneum: No free air. No free fluid or loculated drainable collection. No pathologically enlarged lymph nodes. . Gastrointestinal tract: No evidence of obstruction. Scattered colonic diverticulosis.  . Ureters: Unremarkable. . Bladder: Intraluminal air. Irregular wall contour. . Reproductive System: Hysterectomy.  . Vascular: Unremarkable. . Musculoskeletal: Interval new L1 vertebral body compression deformity with approximately 25% height loss. Polyarticular degenerative changes. No aggressive focal bony lesions. Tiny fat-containing umbilical hernia.    Impression:     Within the limitation of noncontrast technique: 1.  No definite acute findings. 2.  No radiopaque  urolithiasis or obstructive uropathy. 3.  Irregularity of the bladder wall, better assessed on prior contrast-enhanced examinations. Intraluminal air is likely related to recent cystoscopy. 4.  Interval new L1 vertebral body compression deformity. Correlate with point tenderness.      ASSESSMENT AND PLAN [name] is a 83 y.o. 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 Ultimately, after a thorough discussion of the risks, benefits, and alternatives of each approach to management (surveillance vs. intravesical therapy), the patient has elected to proceed with induction intravesical Gemcitabine/Docetaxel, which we agree is reasonable, especially given the nationwide BCG shortage.  Plan: Induction intravesical Gem/Doce to start between 09/12/23 and 09/26/23  Follow-up: RTC for surveillance cystoscopy 6 weeks after last instillation  Shared decision-making: Shared decision-making was conducted with the patient (and family as appropriate) regarding diagnostic and treatment options, incorporating patient values, goals, and understanding of risk.  Counseling: The patient was counseled on the steps, risks, benefits, and alternatives of the recommended intervention, with attention to their preferences, values, and goals of care.  Justification for medical necessity: Oncologic Risk - Given the above diagnosis and the potential for disease progression, non-surgical intervention is warranted. Response to Prior Treatment - Monitoring for recurrence or progression following treatment.  ORDERS No orders of the defined types were placed in this encounter.    PREOPERATIVE NEEDS N/A  MEDICAL DECISION-MAKING Data synthesis: Multiple sources of clinical data, including imaging, pathology, and laboratory results,  were reviewed and integrated to inform diagnosis, staging, and treatment planning. Prior and external records were reconciled where applicable.  Amount and/or Complexity of Data Reviewed and/or Analyzed:  Moderate - Review of labs, imaging, or pathology, or independent interpretation of test results  Number and Complexity of Problems Addressed: Moderate - One or more chronic active conditions, or a new problem with uncertain prognosis Risk of Complications and/or Morbidity or Mortality of Patient Management: Moderate - Cancer workup, biopsy, initiation of therapy, or minor procedures  PREOPERATIVE COUNSELING DISCUSSION I counseled the patient regarding the diagnosis of intermediate-risk non-muscle invasive bladder cancer. We discussed that this condition carries a higher risk of recurrence (approximately 50-60% at 5 years) and a modest risk of progression (about 5-15%) compared to low-risk disease. Management usually involves transurethral resection of the bladder tumor followed by a course of intravesical therapy, most commonly induction with intravesical chemotherapy or BCG. The goal of treatment is to reduce the risk of recurrence and progression. Ongoing surveillance cystoscopy is necessary since there remains a lifelong risk of recurrence.      [1] Past Medical History: Diagnosis Date  . ANA positive 11/09/2013  . Anxiety    prior rx - sertraline (fatigue)  . Bilateral carpal tunnel syndrome 05/07/2022  . Bladder cancer    (CMD)   . Corneal edema 08/01/2012  . 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 Essential hypertension 11/08/2013  . Generalized osteoarthritis of multiple sites    tramadol  prn   . Hearing loss   . History of basal cell cancer 01/17/2015  . History of shingles   . 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 Lumbar disc disease   . Nuclear sclerosis, bilateral 06/10/2013  . OSA (obstructive sleep apnea)  06/02/2014   intolerant to CPAP (dry mouth and dry eye)  . PONV (postoperative nausea and vomiting)    only with general anesthesia  . Pseudophakia of right eye 12/17/2013   Alcon, model [id number], 15.0 diopters, SN # [id number]  . Scleritis and episcleritis 08/01/2012  . Shingles   . Snoring   . Stroke    (CMD)    08/2018 at [facility] .   SABRA Varicella   . Visual impairment 2014   Shingles  [2] Past Surgical History: Procedure Laterality Date  . APPENDECTOMY     Procedure: APPENDECTOMY  . BLADDER NECK SUSPENSION     Procedure: BLADDER NECK SUSPENSION  . BREAST BIOPSY Right    Procedure: BREAST BIOPSY  . CATARACT EXTRACTION Right 12/23/2015   Procedure: YAG CAPSULOTOMY  . CATARACT EXTRACTION     Procedure: CATARACT EXTRACTION  . CATARACT EXTRACTION W/  INTRAOCULAR LENS IMPLANT Right 12/17/2013   Procedure: PHACOEMULSIFICATION PC / IOL TOPICAL;  Surgeon: Donnice Sickles, MD;  Location: [facility] OUTPATIENT OR;  Service: Ophthalmology;  Laterality: Right;  . CATARACT EXTRACTION W/  INTRAOCULAR LENS IMPLANT Left 09/03/2017   Procedure: PHACOEMULSIFICATION PC / IOL TOPICAL;  Surgeon: Donnice Sickles, MD;  Location: [facility] MAIN OR;  Service: Ophthalmology;  Laterality: Left;  . DEBRIDEMENT TENNIS ELBOW Bilateral    Procedure: DEBRIDEMENT TENNIS ELBOW  . FOOT NEUROMA SURGERY Bilateral    Procedure: FOOT NEUROMA SURGERY  . HYSTERECTOMY      Procedure: HYSTERECTOMY  . LUMBAR LAMINECTOMY     Procedure: LUMBAR LAMINECTOMY  . OTHER SURGICAL HISTORY Left    Procedure: OTHER SURGICAL HISTORY (THUMB JOINT)  . OTHER SURGICAL HISTORY     Procedure: OTHER SURGICAL HISTORY (skin caner removal); From nose  . SKIN BIOPSY     Procedure: SKIN BIOPSY  . SPINE SURGERY  2004?  . TRANSURETHRAL  RESECTION OF BLADDER TUMOR N/A 08/29/2023   CYSTOSCOPY TRANSURETHRAL RESECTION BLADDER TUMOR DEEP, BLADDER STONE REMOVAL performed by Hulda Margrette Finder, MD at [facility] OR  [3] Family History Problem  Relation Name Age of Onset  . Cancer Father minette manders        lung  . Cancer Mother Shareta Fishbaugh        breast, colon  . Cataracts Mother Layali Freund   . Hypertension Mother Jacoby Ritsema   . Arthritis Mother Mehr Depaoli   . Heart failure Mother Era Parr        age 23  . Hyperlipidemia Mother Memorie Yokoyama   . Breast cancer Mother Sheletha Bow   . Cancer Paternal Uncle 2 UNCLES   . Heart failure Maternal Grandmother         age 39  . Heart failure Maternal Grandfather    . Cancer Brother Ubaldo sawyers  . Rheum arthritis Neg Hx    . Lupus Neg Hx    . Inflammatory bowel disease Neg Hx    . Psoriasis Neg Hx    . Eczema Neg Hx    [4] Social History Socioeconomic History  . Marital status: Married  Tobacco Use  . Smoking status: Former    Current packs/day: 0.00    Types: Cigarettes    Quit date: 04/17/1988    Years since quitting: 35.4  . Smokeless tobacco: Never  Vaping Use  . Vaping status: Never Used  Substance and Sexual Activity  . Alcohol use: Yes  . Drug use: No  Social History Narrative   2015 Retired [employer] ([employer] - cath lab, cardiac rehab) who lives with Zell   [place]   - Bill had tongue cancer s/p rad and chemo 2014.     G[place] up [place]   - many cousins there     Lives in [place]     Nursing school at [employer]        Hobbies - gardening - shade, herb.. Used to golf- stopped 2/2 shoulder pain. ,   Activity - yardwork, grass. Fit bit steps 10K-15k steps. Sometimes treadmill     Siblings - none   None - none                  Social Drivers of Health   Food Insecurity: Low Risk  (08/29/2023)   Food vital sign   . Within the past 12 months, you worried that your food would run out before you got money to buy more: Never true   . Within the past 12 months, the food you bought just didn't last and you didn't have money to get more: Never true  Transportation Needs: No Transportation Needs (08/29/2023)    Transportation   . In the past 12 months, has lack of reliable transportation kept you from medical appointments, meetings, work or from getting things needed for daily living? : No  Safety: Low Risk  (08/29/2023)   Safety   . How often does anyone, including family and friends, physically hurt you?: Never   . How often does anyone, including family and friends, insult or talk down to you?: Never   . How often does anyone, including family and friends, threaten you with harm?: Never   . How often does anyone, including family and friends, scream or curse at you?: Never  Living Situation: Low Risk  (08/29/2023)   Living Situation   .  What is your living situation today?: I have a steady place to live   . Think about the place you live. Do you have problems with any of the following? Choose all that apply:: None/None on this list  [5] Allergies Allergen Reactions  . Adhesive Dermatitis    Patient said she had a break out from tape on electrodes placed on chest.  . Amitriptyline Other (See Comments)    dryness  . Cephalexin Diarrhea    Bloody diarrhea  . Codeine Dermatitis  . Duloxetine  Mental Status Changes  . Rosuvastatin Myalgias    Trial s/p stroke in 2020   . Amoxicillin Rash    Also caused hematuria  . Gabapentin Angioedema

## 2023-09-25 NOTE — Progress Notes (Signed)
 Chief Complaint:  1. Carpal tunnel syndrome of right wrist  Case request operating room: RELEASE CARPAL TUNNEL    2. Primary osteoarthritis of both hands        HPI:   The [name] is a 83 y.o., female  History of Present Illness The patient presents for evaluation of hand pain.  She continues to experience discomfort in her right hand, describing a lack of sensation and a tingling feeling. She recalls an incident during her hospital stay at the end of 07/2023, where she received an IV in her hand that leaked slightly. A few days after returning home, she noticed a similar sensation in her fingers, which has since spread to her forearm. She also mentions a burning sensation on the back of her arm, which was neither red nor tender to touch. This sensation has improved over time. She is unsure if these symptoms are related to her shoulder or neck. She is eager to resolve these issues as she still has yard work to complete.   She is starting chemotherapy for bladder cancer and has undergone six treatments. She was supposed to have it done today but could not due to cystitis.  PAST SURGICAL HISTORY: She has had surgery on her other hand in the past. .  Pertinent highlights from PMHx and/or other injuries include: ________________________________________________________________________  Problem List[1]  Medical History[2]  Past Surgical History: Surgical History[3]  Social History:  Social History   Socioeconomic History  . Marital status: Widowed    Spouse name: Not on file  . Number of children: Not on file  . Years of education: Not on file  . Highest education level: Not on file  Occupational History  . Not on file  Tobacco Use  . Smoking status: Former    Current packs/day: 0.00    Types: Cigarettes    Quit date: 04/17/1988    Years since quitting: 35.4  . Smokeless tobacco: Never  Vaping Use  . Vaping status: Never Used  Substance and Sexual Activity  .  Alcohol use: Yes  . Drug use: No  . Sexual activity: Not on file  Other Topics Concern  . Not on file  Social History Narrative   2015 Retired [employer] ([employer] - cath lab, cardiac rehab) who lives with Zell   [place]   - Zell had tongue cancer s/p rad and chemo 2014.     Grew up [place]   - many cousins there     Lives in [place]     Nursing school at [employer]        Hobbies - gardening - shade, herb.. Used to golf- stopped 2/2 shoulder pain. ,   Activity - yardwork, grass. Fit bit steps 10K-15k steps. Sometimes treadmill     Siblings - none   None - none                  Social Drivers of Health   Food Insecurity: Low Risk  (08/29/2023)   Food vital sign   . Within the past 12 months, you worried that your food would run out before you got money to buy more: Never true   . Within the past 12 months, the food you bought just didn't last and you didn't have money to get more: Never true  Transportation Needs: No Transportation Needs (08/29/2023)   Transportation   . In the past 12 months, has lack of reliable transportation kept you  from medical appointments, meetings, work or from getting things needed for daily living? : No  Safety: Low Risk  (09/06/2023)   Safety   . How often does anyone, including family and friends, physically hurt you?: Never   . How often does anyone, including family and friends, insult or talk down to you?: Never   . How often does anyone, including family and friends, threaten you with harm?: Never   . How often does anyone, including family and friends, scream or curse at you?: Never  Living Situation: Low Risk  (08/29/2023)   Living Situation   . What is your living situation today?: I have a steady place to live   . Think about the place you live. Do you have problems with any of the following? Choose all that apply:: None/None on this list    Family History: Family History[4]   Allergies: Allergies[5]  Medications:  has a current  medication list which includes the following prescription(s): alprazolam , alprazolam , ascorbic acid, co-enzyme q-10, hydrocortisone, magnesium oxide, omega 3-dha-epa-fish oil, systane (pf), polyethylene glycol, tramadol , triamcinolone acetonide, trospium, valacyclovir , and vitamin b complex.   Review of Systems Review of Systems - all systems were reviewed. Negative except HPI and PMH  Objective: Vital Signs: Vitals:   09/25/23 1302  BP: 121/71  Pulse: 78  Temp: 97.9 F (36.6 C)    PHYSICAL EXAM  GENERAL: No acute distress.  Alert and oriented. Well nourished and well hydrated. Appears stated age.  HEENT : Normocephalic, atraumatic.  Extraocular movements intact.  Mucous membranes moist  NECK: Supple, trachea midline.   MUSCULOSKELETAL EXAM  Physical Exam Musculoskeletal: Right Hand:  Prominent thumb CMC No tender Decreased sensation thumb index, long finger (+) Tinel at carpal tunnel (+) Durkan   Vascular exam:  hands are warm,  normal capillary refill   Assessment:  83 y.o. female with the following musculoskeletal problems:   1.  1. Carpal tunnel syndrome of right wrist  Case request operating room: RELEASE CARPAL TUNNEL    2. Primary osteoarthritis of both hands        Recommendations:  Assessment & Plan 1. Hand pain: Persistent hand pain with tingling and numbness radiating to the forearm. The pain may be related to previous hospitalization and IV infiltration. Improvement noted in shoulder pain, likely originating from the neck. A procedure to numb the hand is scheduled for 12/10/2023 at the new surgical center across the street. The procedure will take approximately 20 minutes. She was advised to sign the permit.   2. Bladder cancer: Currently undergoing chemotherapy for bladder cancer, with six treatments planned. Today's treatment was postponed due to cystitis. Chemotherapy is administered directly into the bladder, with anticipated minimal side  effects.   Follow-up: 12/10/2023  A joint decision is made with the patient to proceed with surgical intervention for a chronic condition with ongoing acute worsening. The condition is significantly impact in the functionality of the extremity. Right CTR, will be done after she finishing chemo therapy for bladder Ca. This decision regarding major elective surgery is made in context of identified patient and procedure risk factors. Risk and benefits were discussed with the patient and /or family, including the risks and potential complications: nerve injury, vessel injury, infection, failure to achieve desired results, possible need for additional surgery and blood clots. The option of doing nothing exists, and the risks and potential complications of this are detailed and deformity. The alternatives to doing nothing include: chronic analgesics, non-steroidals and physical or occupational  therapy, and their potential complications include: nerve injury, vessel injury, infection, failure to achieve desired results and possible need for additional surgery. They expressed their understanding. Informed consent was obtained today.        [1] Patient Active Problem List Diagnosis  . Essential hypertension  . Generalized osteoarthritis of multiple sites  . OSA (obstructive sleep apnea)  . Senile osteoporosis  . History of shingles  . Intermediate uveitis of right eye  . Corneal edema  . Scleritis and episcleritis  . Nuclear sclerosis of left eye  . 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 Pseudophakia of right eye  . Chronic fatigue  . Chest rales  . Cystoid macular edema of right eye  . Keratopathy  . Hyperlipidemia  . Tubular adenoma of colon  . History of basal cell cancer  . Intertrigo  . Primary osteoarthritis of both hands  . Impingement syndrome of right shoulder  . Primary osteoarthritis of right shoulder  . Bilateral carpal tunnel syndrome  . Osteoarthritis of  carpometacarpal (CMC) joints of both thumbs  . Carpal tunnel syndrome of right wrist  . Activity, other caregiving  . Stress  . Closed compression fracture of L1 lumbar vertebra, initial encounter    (CMD)  . Gross hematuria  . Bladder tumor  . History of compression fracture of spine  . Former smoker  [2] Past Medical History: Diagnosis Date  . ANA positive 11/09/2013  . Anxiety    prior rx - sertraline (fatigue)  . Bilateral carpal tunnel syndrome 05/07/2022  . Bladder cancer    (CMD)   . Corneal edema 08/01/2012  . 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 Essential hypertension 11/08/2013  . Generalized osteoarthritis of multiple sites    tramadol  prn   . Hearing loss   . History of basal cell cancer 01/17/2015  . History of shingles   . 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 Lumbar disc disease   . Nuclear sclerosis, bilateral 06/10/2013  . OSA (obstructive sleep apnea) 06/02/2014   intolerant to CPAP (dry mouth and dry eye)  . PONV (postoperative nausea and vomiting)    only with general anesthesia  . Pseudophakia of right eye 12/17/2013   Alcon, model [id number], 15.0 diopters, SN # [id number]  . Scleritis and episcleritis 08/01/2012  . Shingles   . Snoring   . Stroke    (CMD)    08/2018 at [facility] - MRI .   SABRA Varicella   . Visual impairment 2014   Shingles  [3] Past Surgical History: Procedure Laterality Date  . APPENDECTOMY     Procedure: APPENDECTOMY  . BLADDER NECK SUSPENSION     Procedure: BLADDER NECK SUSPENSION  . BREAST BIOPSY Right    Procedure: BREAST BIOPSY  . CATARACT EXTRACTION Right 12/23/2015   Procedure: YAG CAPSULOTOMY  . CATARACT EXTRACTION     Procedure: CATARACT EXTRACTION  . CATARACT EXTRACTION W/  INTRAOCULAR LENS IMPLANT Right 12/17/2013   Procedure: PHACOEMULSIFICATION PC / IOL TOPICAL;  Surgeon: Donnice Sickles, MD;  Location: [facility] OUTPATIENT OR;  Service:  Ophthalmology;  Laterality: Right;  . CATARACT EXTRACTION W/  INTRAOCULAR LENS IMPLANT Left 09/03/2017   Procedure: PHACOEMULSIFICATION PC / IOL TOPICAL;  Surgeon: Donnice Sickles, MD;  Location: [facility] MAIN OR;  Service: Ophthalmology;  Laterality: Left;  . DEBRIDEMENT TENNIS ELBOW Bilateral    Procedure: DEBRIDEMENT TENNIS ELBOW  . FOOT NEUROMA SURGERY Bilateral  Procedure: FOOT NEUROMA SURGERY  . HYSTERECTOMY      Procedure: HYSTERECTOMY  . LUMBAR LAMINECTOMY     Procedure: LUMBAR LAMINECTOMY  . OTHER SURGICAL HISTORY Left    Procedure: OTHER SURGICAL HISTORY (THUMB JOINT)  . OTHER SURGICAL HISTORY     Procedure: OTHER SURGICAL HISTORY (skin caner removal); From nose  . SKIN BIOPSY     Procedure: SKIN BIOPSY  . SPINE SURGERY  2004?  . TRANSURETHRAL RESECTION OF BLADDER TUMOR N/A 08/29/2023   CYSTOSCOPY TRANSURETHRAL RESECTION BLADDER TUMOR DEEP, BLADDER STONE REMOVAL performed by Hulda Margrette Finder, MD at [facility] OR  [4] Family History Problem Relation Name Age of Onset  . Cancer Father kailene steinhart        lung  . Cancer Mother Shirell Struthers        breast, colon  . Cataracts Mother Tammera Engert   . Hypertension Mother Ariatna Jester   . Arthritis Mother Terika Pillard   . Heart failure Mother Ladaysha Soutar        age 74  . Hyperlipidemia Mother Apolonia Ellwood   . Breast cancer Mother Le Ferraz   . Cancer Paternal Uncle 2 UNCLES   . Heart failure Maternal Grandmother         age 4  . Heart failure Maternal Grandfather    . Cancer Brother Ubaldo sawyers  . Rheum arthritis Neg Hx    . Lupus Neg Hx    . Inflammatory bowel disease Neg Hx    . Psoriasis Neg Hx    . Eczema Neg Hx    [5] Allergies Allergen Reactions  . Adhesive Dermatitis    Patient said she had a break out from tape on electrodes placed on chest.  . Amitriptyline Other (See Comments)    dryness  . Cephalexin Diarrhea    Bloody diarrhea  . Codeine Dermatitis  . Duloxetine   Mental Status Changes  . Rosuvastatin Myalgias    Trial s/p stroke in 2020   . Amoxicillin Rash    Also caused hematuria  . Gabapentin Angioedema

## 2023-12-11 NOTE — Progress Notes (Signed)
 Subjective: [name] is a 83 y.o. female who comes in today for evaluation of skin issues. History of Present Illness There was a small ulcer that I noted under the tongue at last visit. She had also reported hoarseness. At this time the sore seems to have healed up. Hoarseness is better too. She had to go through chemo for bladder cancer recently and has been experiencing side effects  She has a skin tear on her L forearm arm that is not healing well. She has been using Neosporin and has attempted to keep it covered with tape and nylon netting, but it remains raw.   Has a few rough spots on the skin   At prior visit she had rash on face for which we prescribed HC 2.5% cream. This was not discussed today.      PMH negative for eczema. 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  Review of systems is negative for any other lumps, swelling, or dermatologic conditions.   Objective:   Patient is well nourished, well hydrated individual in no acute distress with appropriate mood and affect. Exam today focused on the skin and included the following areas and findings:   No active ulcers in the mouth - multiple stuck-on papules and plaques on trunk, face, and arms consistent with seborrheic keratoses  - many scattered dark red papules consistent with cherry angiomas  - well healed surgery site on the L nose - erythematous thin papule on the R jawline - LN2  Assessment:   Assessment & Plan 1. Skin tear on the arm. - The patient has a skin tear on her arm that is not healing well. She has been using Neosporin but reports inadequate healing. Silvadene cream, an antibiotic burn cream, is recommended for better healing. The nurse will apply Silvadene cream and replace the bandage during this visit. She is advised to keep the area covered to prevent further irritation.  2. Seborrheic keratoses. - The patient has rough spots along the edge of her  hairline and other areas, identified as seborrheic keratoses. These are benign and do not require treatment.  3. AK on the cheek. - After discussion of risks and benefits and obtaining verbal consent, cryotherapy for 5 seconds for 2 rounds was applied to 1 AK. Patient tolerated the procedure well and instructions for care were provided.   Follow-up: Next year.

## 2023-12-16 ENCOUNTER — Inpatient Hospital Stay (HOSPITAL_COMMUNITY)
Admission: EM | Admit: 2023-12-16 | Discharge: 2023-12-18 | DRG: 871 | Disposition: A | Attending: Internal Medicine | Admitting: Internal Medicine

## 2023-12-16 ENCOUNTER — Other Ambulatory Visit: Payer: Self-pay

## 2023-12-16 ENCOUNTER — Encounter (HOSPITAL_COMMUNITY): Payer: Self-pay | Admitting: Emergency Medicine

## 2023-12-16 ENCOUNTER — Emergency Department (HOSPITAL_COMMUNITY)

## 2023-12-16 DIAGNOSIS — A419 Sepsis, unspecified organism: Secondary | ICD-10-CM | POA: Diagnosis present

## 2023-12-16 DIAGNOSIS — Z8619 Personal history of other infectious and parasitic diseases: Secondary | ICD-10-CM | POA: Diagnosis not present

## 2023-12-16 DIAGNOSIS — Z79899 Other long term (current) drug therapy: Secondary | ICD-10-CM | POA: Diagnosis not present

## 2023-12-16 DIAGNOSIS — E8721 Acute metabolic acidosis: Secondary | ICD-10-CM | POA: Diagnosis present

## 2023-12-16 DIAGNOSIS — M545 Low back pain, unspecified: Secondary | ICD-10-CM | POA: Diagnosis present

## 2023-12-16 DIAGNOSIS — Z888 Allergy status to other drugs, medicaments and biological substances status: Secondary | ICD-10-CM | POA: Diagnosis not present

## 2023-12-16 DIAGNOSIS — K8020 Calculus of gallbladder without cholecystitis without obstruction: Secondary | ICD-10-CM | POA: Diagnosis present

## 2023-12-16 DIAGNOSIS — K7200 Acute and subacute hepatic failure without coma: Secondary | ICD-10-CM | POA: Diagnosis present

## 2023-12-16 DIAGNOSIS — C679 Malignant neoplasm of bladder, unspecified: Secondary | ICD-10-CM | POA: Diagnosis present

## 2023-12-16 DIAGNOSIS — Z87440 Personal history of urinary (tract) infections: Secondary | ICD-10-CM | POA: Diagnosis not present

## 2023-12-16 DIAGNOSIS — Z885 Allergy status to narcotic agent status: Secondary | ICD-10-CM | POA: Diagnosis not present

## 2023-12-16 DIAGNOSIS — M1990 Unspecified osteoarthritis, unspecified site: Secondary | ICD-10-CM | POA: Diagnosis present

## 2023-12-16 DIAGNOSIS — G500 Trigeminal neuralgia: Secondary | ICD-10-CM | POA: Diagnosis present

## 2023-12-16 DIAGNOSIS — Z1152 Encounter for screening for COVID-19: Secondary | ICD-10-CM | POA: Diagnosis not present

## 2023-12-16 DIAGNOSIS — Z87891 Personal history of nicotine dependence: Secondary | ICD-10-CM | POA: Diagnosis not present

## 2023-12-16 DIAGNOSIS — Z880 Allergy status to penicillin: Secondary | ICD-10-CM | POA: Diagnosis not present

## 2023-12-16 DIAGNOSIS — R6510 Systemic inflammatory response syndrome (SIRS) of non-infectious origin without acute organ dysfunction: Principal | ICD-10-CM | POA: Diagnosis present

## 2023-12-16 DIAGNOSIS — R7989 Other specified abnormal findings of blood chemistry: Secondary | ICD-10-CM | POA: Diagnosis present

## 2023-12-16 DIAGNOSIS — Z9109 Other allergy status, other than to drugs and biological substances: Secondary | ICD-10-CM | POA: Diagnosis not present

## 2023-12-16 DIAGNOSIS — Z881 Allergy status to other antibiotic agents status: Secondary | ICD-10-CM | POA: Diagnosis not present

## 2023-12-16 DIAGNOSIS — R6521 Severe sepsis with septic shock: Secondary | ICD-10-CM | POA: Diagnosis present

## 2023-12-16 DIAGNOSIS — K5730 Diverticulosis of large intestine without perforation or abscess without bleeding: Secondary | ICD-10-CM | POA: Diagnosis present

## 2023-12-16 LAB — COMPREHENSIVE METABOLIC PANEL WITH GFR
ALT: 103 U/L — ABNORMAL HIGH (ref 0–44)
AST: 163 U/L — ABNORMAL HIGH (ref 15–41)
Albumin: 4.2 g/dL (ref 3.5–5.0)
Alkaline Phosphatase: 97 U/L (ref 38–126)
Anion gap: 17 — ABNORMAL HIGH (ref 5–15)
BUN: 16 mg/dL (ref 8–23)
CO2: 21 mmol/L — ABNORMAL LOW (ref 22–32)
Calcium: 9.4 mg/dL (ref 8.9–10.3)
Chloride: 102 mmol/L (ref 98–111)
Creatinine, Ser: 0.65 mg/dL (ref 0.44–1.00)
GFR, Estimated: >60 mL/min (ref >60)
Glucose, Bld: 95 mg/dL (ref 70–99)
Potassium: 4.1 mmol/L (ref 3.5–5.1)
Sodium: 140 mmol/L (ref 135–145)
Total Bilirubin: 0.7 mg/dL (ref 0.0–1.2)
Total Protein: 6.4 g/dL — ABNORMAL LOW (ref 6.5–8.1)

## 2023-12-16 LAB — CBC WITH DIFFERENTIAL/PLATELET
Abs Immature Granulocytes: 0.01 K/uL (ref 0.00–0.07)
Basophils Absolute: 0 K/uL (ref 0.0–0.1)
Basophils Relative: 0 %
Eosinophils Absolute: 0 K/uL (ref 0.0–0.5)
Eosinophils Relative: 0 %
HCT: 45.4 % (ref 36.0–46.0)
Hemoglobin: 15.3 g/dL — ABNORMAL HIGH (ref 12.0–15.0)
Immature Granulocytes: 0 %
Lymphocytes Relative: 1 %
Lymphs Abs: 0.1 K/uL — ABNORMAL LOW (ref 0.7–4.0)
MCH: 32.1 pg (ref 26.0–34.0)
MCHC: 33.7 g/dL (ref 30.0–36.0)
MCV: 95.4 fL (ref 80.0–100.0)
Monocytes Absolute: 0 K/uL — ABNORMAL LOW (ref 0.1–1.0)
Monocytes Relative: 0 %
Neutro Abs: 4.7 K/uL (ref 1.7–7.7)
Neutrophils Relative %: 99 %
Platelets: 226 K/uL (ref 150–400)
RBC: 4.76 MIL/uL (ref 3.87–5.11)
RDW: 12.9 % (ref 11.5–15.5)
Smear Review: NORMAL
WBC: 4.8 K/uL (ref 4.0–10.5)
nRBC: 0 % (ref 0.0–0.2)

## 2023-12-16 LAB — RESP PANEL BY RT-PCR (RSV, FLU A&B, COVID)  RVPGX2
Influenza A by PCR: NEGATIVE
Influenza B by PCR: NEGATIVE
Resp Syncytial Virus by PCR: NEGATIVE
SARS Coronavirus 2 by RT PCR: NEGATIVE

## 2023-12-16 LAB — URINALYSIS, W/ REFLEX TO CULTURE (INFECTION SUSPECTED)
Bacteria, UA: NONE SEEN
Bilirubin Urine: NEGATIVE
Glucose, UA: NEGATIVE mg/dL
Hgb urine dipstick: NEGATIVE
Ketones, ur: NEGATIVE mg/dL
Nitrite: NEGATIVE
Protein, ur: NEGATIVE mg/dL
Specific Gravity, Urine: 1.011 (ref 1.005–1.030)
pH: 5 (ref 5.0–8.0)

## 2023-12-16 LAB — LIPASE, BLOOD: Lipase: 59 U/L — ABNORMAL HIGH (ref 11–51)

## 2023-12-16 LAB — PROTIME-INR
INR: 0.9 (ref 0.8–1.2)
Prothrombin Time: 13.1 s (ref 11.4–15.2)

## 2023-12-16 LAB — LACTIC ACID, PLASMA: Lactic Acid, Venous: 8 mmol/L (ref 0.5–1.9)

## 2023-12-16 MED ORDER — ONDANSETRON HCL 4 MG PO TABS: 4.0000 mg | ORAL_TABLET | Freq: Four times a day (QID) | ORAL | Status: DC | PRN

## 2023-12-16 MED ORDER — LACTATED RINGERS IV BOLUS
1000.0000 mL | Freq: Once | INTRAVENOUS | Status: AC
  Administered 2023-12-16: 1000 mL via INTRAVENOUS

## 2023-12-16 MED ORDER — VALACYCLOVIR HCL 500 MG PO TABS
500.0000 mg | ORAL_TABLET | Freq: Every day | ORAL | Status: DC
  Filled 2023-12-16 (×3): qty 1

## 2023-12-16 MED ORDER — MORPHINE SULFATE (PF) 4 MG/ML IV SOLN
4.0000 mg | Freq: Once | INTRAVENOUS | Status: AC
  Administered 2023-12-16: 4 mg via INTRAVENOUS
  Filled 2023-12-16: qty 1

## 2023-12-16 MED ORDER — TRAMADOL HCL 50 MG PO TABS
25.0000 mg | ORAL_TABLET | Freq: Two times a day (BID) | ORAL | Status: DC | PRN
  Administered 2023-12-17: 50 mg via ORAL
  Filled 2023-12-16: qty 1

## 2023-12-16 MED ORDER — OXYCODONE-ACETAMINOPHEN 5-325 MG PO TABS: 0.5000 | ORAL_TABLET | Freq: Four times a day (QID) | ORAL | Status: DC | PRN

## 2023-12-16 MED ORDER — LACTATED RINGERS IV SOLN: INTRAVENOUS | Status: AC

## 2023-12-16 MED ORDER — SODIUM CHLORIDE 0.9 % IV BOLUS
500.0000 mL | Freq: Once | INTRAVENOUS | Status: AC
  Administered 2023-12-16: 500 mL via INTRAVENOUS

## 2023-12-16 MED ORDER — SODIUM CHLORIDE 0.9 % IV SOLN
1.0000 g | Freq: Once | INTRAVENOUS | Status: AC
  Administered 2023-12-16: 1 g via INTRAVENOUS
  Filled 2023-12-16: qty 10

## 2023-12-16 MED ORDER — POLYETHYLENE GLYCOL 3350 17 G PO PACK: 17.0000 g | PACK | Freq: Every day | ORAL | Status: DC | PRN

## 2023-12-16 MED ORDER — KETOROLAC TROMETHAMINE 15 MG/ML IJ SOLN
15.0000 mg | Freq: Once | INTRAMUSCULAR | Status: AC
  Administered 2023-12-16: 15 mg via INTRAVENOUS
  Filled 2023-12-16: qty 1

## 2023-12-16 MED ORDER — MIDODRINE HCL 5 MG PO TABS
10.0000 mg | ORAL_TABLET | ORAL | Status: AC
  Administered 2023-12-16: 10 mg via ORAL
  Filled 2023-12-16: qty 2

## 2023-12-16 MED ORDER — IOHEXOL 300 MG/ML  SOLN
100.0000 mL | Freq: Once | INTRAMUSCULAR | Status: AC | PRN
  Administered 2023-12-16: 100 mL via INTRAVENOUS

## 2023-12-16 MED ORDER — SODIUM CHLORIDE 0.9 % IV SOLN
2.0000 g | INTRAVENOUS | Status: DC
  Administered 2023-12-17: 2 g via INTRAVENOUS
  Filled 2023-12-16: qty 20

## 2023-12-16 MED ORDER — ONDANSETRON HCL 4 MG/2ML IJ SOLN
4.0000 mg | Freq: Four times a day (QID) | INTRAMUSCULAR | Status: DC | PRN
  Administered 2023-12-17: 4 mg via INTRAVENOUS
  Filled 2023-12-16: qty 2

## 2023-12-16 MED ORDER — ACETAMINOPHEN 325 MG PO TABS
650.0000 mg | ORAL_TABLET | Freq: Four times a day (QID) | ORAL | Status: DC | PRN
  Administered 2023-12-17 (×3): 650 mg via ORAL
  Filled 2023-12-16 (×3): qty 2

## 2023-12-16 MED ORDER — ACETAMINOPHEN 650 MG RE SUPP: 650.0000 mg | Freq: Four times a day (QID) | RECTAL | Status: DC | PRN

## 2023-12-16 MED ORDER — ENOXAPARIN SODIUM 40 MG/0.4ML IJ SOSY
40.0000 mg | PREFILLED_SYRINGE | INTRAMUSCULAR | Status: DC
  Administered 2023-12-16 – 2023-12-17 (×2): 40 mg via SUBCUTANEOUS
  Filled 2023-12-16 (×2): qty 0.4

## 2023-12-16 MED ORDER — ALPRAZOLAM 0.25 MG PO TABS: 0.2500 mg | ORAL_TABLET | Freq: Every evening | ORAL | Status: DC | PRN

## 2023-12-16 MED ORDER — ONDANSETRON HCL 4 MG/2ML IJ SOLN
4.0000 mg | Freq: Once | INTRAMUSCULAR | Status: AC
  Administered 2023-12-16: 4 mg via INTRAVENOUS
  Filled 2023-12-16: qty 2

## 2023-12-16 NOTE — Telephone Encounter (Signed)
 Patient calling to advise that she has back pains and chills, requesting to speak with a nurse for medication to help. Please advise thanks     CB: [phone]

## 2023-12-16 NOTE — ED Triage Notes (Signed)
 Pt bib rcems w/ c/o pain after a cystoscopy. PT previously had a surgical procedure done to remove cancer from her bladder. PT had follow up today and reports severe pain. Pt reports this happened the last time she had this procedure and she was admitted into the hospital for 2 days. Pt reports pain 12/10

## 2023-12-16 NOTE — ED Provider Notes (Signed)
 [place] EMERGENCY DEPARTMENT AT [facility] Provider Note   CSN: [id number] Arrival date & time: 12/16/23  1514     Patient presents with: No chief complaint on file.   [name] is a 83 y.o. female.  She has past history of bladder cancer, low back pain and arthritis.  Presents to ER today for pain after cystoscopy.  Patient had follow-up cystoscopy today for surveillance.  Reports she developed severe lower abdominal and low back pain that is rated 10 out of 10 on the pain scale with chills, not checked temperature.  She took ibuprofen and hydrocodone without relief of her pain.  States this feels the same as when she had pyelonephritis and had to be hospitalized after her cystoscopy several months ago.   HPI     Prior to Admission medications   Medication Sig Start Date End Date Taking? Authorizing Provider  ALPRAZolam  (XANAX ) 0.5 MG tablet Take 0.5 mg by mouth at bedtime as needed for sleep.    [provider]  DULoxetine  (CYMBALTA ) 30 MG capsule Take 1 capsule (30 mg total) by mouth daily. 04/01/12   Penumalli, Vikram R, MD  traMADol  (ULTRAM ) 50 MG tablet Take by mouth every 6 (six) hours as needed for pain (1/2 tab @@ hs prn).    [provider]    Allergies: Cephalexin and Codeine    Review of Systems  Updated Vital Signs There were no vitals taken for this visit.  Physical Exam Vitals and nursing note reviewed.  Constitutional:      General: She is not in acute distress.    Appearance: She is well-developed.  HENT:     Head: Normocephalic and atraumatic.  Eyes:     Conjunctiva/sclera: Conjunctivae normal.     Pupils: Pupils are equal, round, and reactive to light.  Cardiovascular:     Rate and Rhythm: Normal rate and regular rhythm.     Heart sounds: No murmur heard. Pulmonary:     Effort: Pulmonary effort is normal. No respiratory distress.     Breath sounds: Normal breath sounds.  Abdominal:     Palpations: Abdomen  is soft.     Tenderness: There is abdominal tenderness in the suprapubic area. There is guarding.  Musculoskeletal:        General: No swelling.     Cervical back: Neck supple.  Skin:    General: Skin is warm and dry.     Capillary Refill: Capillary refill takes less than 2 seconds.  Neurological:     General: No focal deficit present.     Mental Status: She is alert and oriented to person, place, and time.     Sensory: No sensory deficit.     Motor: No weakness.  Psychiatric:        Mood and Affect: Mood normal.     (all labs ordered are listed, but only abnormal results are displayed) Labs Reviewed - No data to display  EKG: None  Radiology: No results found.   Procedures   Medications Ordered in the ED - No data to display                                  Medical Decision Making This patient presents to the ED for concern of fever and low back pain, this involves an extensive number of treatment options, and is a complaint that carries with it a high risk  of complications and morbidity.  The differential diagnosis includes muscle strain, UTI, pyelonephritis, discitis, HNP, viral illness, other   Co morbidities that complicate the patient evaluation :   bladder cancer   Additional history obtained:  Additional history obtained from EMR External records from outside source obtained and reviewed including [facility] records for prior cystoscopy and position for pyelonephritis in August 2025, cystoscopy note from today, prior notes and labs   Lab Tests:  I Ordered, and personally interpreted labs.  The pertinent results include:  Respiratory panel negative, CBC with no leukocytosis or anemia, INR is normal, CMP with AST 163, ALT 103, normal alk phos and bilirubin, anion gap elevated at 17 with CO2 of 21, UA with trace leukocytes, no red blood cells, white blood cells or bacteria seen   Imaging Studies ordered:  I ordered imaging studies including CT abdomen  pelvis which shows air in the bladder likely post instrumentation, no other acute findings I independently visualized and interpreted imaging within scope of identifying emergent findings  I agree with the radiologist interpretation   Cardiac Monitoring: / EKG:  The patient was maintained on a cardiac monitor.  I personally viewed and interpreted the cardiac monitored which showed an underlying rhythm of: Sinus rhythm   Consultations Obtained:  I requested consultation with the hospitalist,  and discussed lab and imaging findings as well as pertinent plan - they recommend: admission    Problem List / ED Course / Critical interventions / Medication management  Fever, chills, low back pain to very low back pain, lower abdominal pain and fever after cystoscopy today.  Had similar problem in the past and at that time she was admitted to the hospital for pyelonephritis.  Labs did not show any leukocytosis, did have a mild elevation in AST and ALT and some increased anion gap.  Patient given IV fluids due to her tachycardia which is improved to around 101 beats minute from 112 initially.  Urine and blood cultures are in process.  Given persistent pain and SIRS vitals, patient was given IV Rocephin  and will be admitted to hospitalist service.  Patient's oxygen saturation dropped to around 88% after administration of IV morphine  so was put on 2 L nasal cannula.  Chest x-ray was stable.  No shortness of breath, likely from the morphine .  There are some concern for possible bacteremia I ordered medication including IV morphine  for pain Reevaluation of the patient after these medicines showed that the patient improved I have reviewed the patients home medicines and have made adjustments as needed       Amount and/or Complexity of Data Reviewed Labs: ordered. Radiology: ordered.  Risk Prescription drug management. Decision regarding hospitalization.        Final diagnoses:  None    ED  Discharge Orders     None          Suellen Sherran LABOR, PA-C 12/16/23 2323
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
## 2023-12-17 ENCOUNTER — Other Ambulatory Visit: Payer: Self-pay

## 2023-12-17 LAB — BASIC METABOLIC PANEL WITH GFR
Anion gap: 14 (ref 5–15)
BUN: 20 mg/dL (ref 8–23)
CO2: 17 mmol/L — ABNORMAL LOW (ref 22–32)
Calcium: 7.2 mg/dL — ABNORMAL LOW (ref 8.9–10.3)
Chloride: 103 mmol/L (ref 98–111)
Creatinine, Ser: 0.91 mg/dL (ref 0.44–1.00)
GFR, Estimated: >60 mL/min (ref >60)
Glucose, Bld: 121 mg/dL — ABNORMAL HIGH (ref 70–99)
Potassium: 4.3 mmol/L (ref 3.5–5.1)
Sodium: 135 mmol/L (ref 135–145)

## 2023-12-17 LAB — HEPATIC FUNCTION PANEL
ALT: 118 U/L — ABNORMAL HIGH (ref 0–44)
AST: 128 U/L — ABNORMAL HIGH (ref 15–41)
Albumin: 3.2 g/dL — ABNORMAL LOW (ref 3.5–5.0)
Alkaline Phosphatase: 77 U/L (ref 38–126)
Bilirubin, Direct: 0.9 mg/dL — ABNORMAL HIGH (ref 0.0–0.2)
Indirect Bilirubin: 0.4 mg/dL (ref 0.3–0.9)
Total Bilirubin: 1.3 mg/dL — ABNORMAL HIGH (ref 0.0–1.2)
Total Protein: 4.7 g/dL — ABNORMAL LOW (ref 6.5–8.1)

## 2023-12-17 LAB — CBC
HCT: 39.4 % (ref 36.0–46.0)
Hemoglobin: 13.3 g/dL (ref 12.0–15.0)
MCH: 32.4 pg (ref 26.0–34.0)
MCHC: 33.8 g/dL (ref 30.0–36.0)
MCV: 96.1 fL (ref 80.0–100.0)
Platelets: 199 K/uL (ref 150–400)
RBC: 4.1 MIL/uL (ref 3.87–5.11)
RDW: 13.2 % (ref 11.5–15.5)
WBC: 15.4 K/uL — ABNORMAL HIGH (ref 4.0–10.5)
nRBC: 0 % (ref 0.0–0.2)

## 2023-12-17 LAB — LACTIC ACID, PLASMA
Lactic Acid, Venous: 1.4 mmol/L (ref 0.5–1.9)
Lactic Acid, Venous: 3.2 mmol/L (ref 0.5–1.9)
Lactic Acid, Venous: 3.6 mmol/L (ref 0.5–1.9)
Lactic Acid, Venous: 4.5 mmol/L (ref 0.5–1.9)
Lactic Acid, Venous: 7 mmol/L (ref 0.5–1.9)

## 2023-12-17 LAB — MRSA NEXT GEN BY PCR, NASAL: MRSA by PCR Next Gen: NOT DETECTED

## 2023-12-17 MED ORDER — CALCIUM GLUCONATE-NACL 1-0.675 GM/50ML-% IV SOLN
1.0000 g | Freq: Once | INTRAVENOUS | Status: AC
  Administered 2023-12-17: 1000 mg via INTRAVENOUS
  Filled 2023-12-17: qty 50

## 2023-12-17 MED ORDER — LACTATED RINGERS IV SOLN: INTRAVENOUS | Status: DC

## 2023-12-17 MED ORDER — LACTATED RINGERS IV BOLUS
1000.0000 mL | Freq: Once | INTRAVENOUS | Status: AC
  Administered 2023-12-17: 1000 mL via INTRAVENOUS

## 2023-12-17 MED ORDER — CHLORHEXIDINE GLUCONATE CLOTH 2 % EX PADS
6.0000 | MEDICATED_PAD | Freq: Every day | CUTANEOUS | Status: DC
  Administered 2023-12-17: 6 via TOPICAL

## 2023-12-17 MED ORDER — NOREPINEPHRINE 4 MG/250ML-% IV SOLN: 0.0000 ug/min | INTRAVENOUS | Status: DC

## 2023-12-17 MED ORDER — NOREPINEPHRINE 4 MG/250ML-% IV SOLN
0.0000 ug/min | INTRAVENOUS | Status: DC
  Administered 2023-12-17: 5 ug/min via INTRAVENOUS
  Administered 2023-12-18: 4 ug/min via INTRAVENOUS
  Filled 2023-12-17 (×2): qty 250

## 2023-12-17 MED ORDER — SODIUM CHLORIDE 0.9 % IV SOLN: 250.0000 mL | INTRAVENOUS | Status: DC

## 2023-12-17 NOTE — Hospital Course (Addendum)
 83 y.o. female with medical history significant for bladder cancer, trigeminal neuralgia, arthritis.  Patient presented to the ED with complaints of lower abdominal pain and back pain that started after her cystoscopy this morning.  Patient had flexible otoscopy this morning to follow-up on her bladder cancer.  She reports about 3 hours after she had been discharged, she started having lower abdominal pain, and back pain across her back.  Before the procedure she was given Macrobid.  She denies pain with urination, no vomiting no diarrhea, no chest pain no cough no difficulty breathing.   Similar occurrence after cystoscopy 7/30 when she was hospitalized at [facility] 7/31 to 8/2 for acute pyelonephritis and sepsis secondary to UTI, also oral candidiasis.-She was hypotensive with a leukocytosis, lactic acidosis and febrile to 102.9.  Blood and urine cultures were negative.  She was treated with IV ceftriaxone .   ED Course: Febrile to 100.2.  Heart rate 94-112.  Respirate rate 18-20.  Blood pressure systolic 79-120.  O2 sats greater 93% on room air.  WBC 4.8.  UA with trace leukocytes.  Chest x-ray clear.  CT abdomen pelvis with contrast shows cholelithiasis and diverticulosis without diverticulitis. IV ceftriaxone  started.  1 L bolus given.

## 2023-12-17 NOTE — Progress Notes (Signed)
   12/17/23 1029  TOC Brief Assessment  Insurance and Status Reviewed  Patient has primary care physician Yes  Home environment has been reviewed Home alone  Prior level of function: Independent  Prior/Current Home Services No current home services  Social Drivers of Health Review SDOH reviewed no interventions necessary  Readmission risk has been reviewed Yes  Transition of care needs no transition of care needs at this time   Inpatient Care Manager (ICM) has reviewed patient and no ICM needs have been identified at this time. We will continue to monitor patient advancement through interdisciplinary progression rounds. If new patient transition needs arise, please place a ICM consult.

## 2023-12-17 NOTE — Progress Notes (Signed)
 Patient requesting to be transferred to [facility]. Dr. Vicci made aware. Face sheet faxed and radiology records sent per request. Currently no ICU beds. Patient updated on the above.

## 2023-12-17 NOTE — Plan of Care (Signed)
  Problem: Education: Goal: Knowledge of General Education information will improve Description: Including pain rating scale, medication(s)/side effects and non-pharmacologic comfort measures Outcome: Progressing   Problem: Clinical Measurements: Goal: Ability to maintain clinical measurements within normal limits will improve Outcome: Progressing   Problem: Activity: Goal: Risk for activity intolerance will decrease Outcome: Progressing   Problem: Fluid Volume: Goal: Hemodynamic stability will improve Outcome: Progressing   Problem: Clinical Measurements: Goal: Diagnostic test results will improve Outcome: Progressing Goal: Signs and symptoms of infection will decrease Outcome: Progressing   Problem: Respiratory: Goal: Ability to maintain adequate ventilation will improve Outcome: Progressing

## 2023-12-17 NOTE — Progress Notes (Signed)
 I received a Call from CCMD about a 5 beat run of unsustained Vtach Dr. Vicci has been made aware

## 2023-12-17 NOTE — Progress Notes (Signed)
 PICC consult: Discussed PICC insertion, risks and benefits with patient. She declined PICC insertion this evening. Pt stated she will consider it overnight and make a decision based on how she feels in the morning. She would like to discuss with MD in the morning as well before deciding. All questions answered.
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
## 2023-12-18 ENCOUNTER — Inpatient Hospital Stay (HOSPITAL_COMMUNITY)

## 2023-12-18 DIAGNOSIS — A419 Sepsis, unspecified organism: Secondary | ICD-10-CM

## 2023-12-18 DIAGNOSIS — C679 Malignant neoplasm of bladder, unspecified: Secondary | ICD-10-CM | POA: Diagnosis not present

## 2023-12-18 LAB — CBC WITH DIFFERENTIAL/PLATELET
Abs Immature Granulocytes: 0.02 K/uL (ref 0.00–0.07)
Basophils Absolute: 0 K/uL (ref 0.0–0.1)
Basophils Relative: 1 %
Eosinophils Absolute: 0 K/uL (ref 0.0–0.5)
Eosinophils Relative: 1 %
HCT: 40.4 % (ref 36.0–46.0)
Hemoglobin: 13.6 g/dL (ref 12.0–15.0)
Immature Granulocytes: 0 %
Lymphocytes Relative: 4 %
Lymphs Abs: 0.2 K/uL — ABNORMAL LOW (ref 0.7–4.0)
MCH: 32.7 pg (ref 26.0–34.0)
MCHC: 33.7 g/dL (ref 30.0–36.0)
MCV: 97.1 fL (ref 80.0–100.0)
Monocytes Absolute: 0.3 K/uL (ref 0.1–1.0)
Monocytes Relative: 5 %
Neutro Abs: 5.4 K/uL (ref 1.7–7.7)
Neutrophils Relative %: 89 %
Platelets: 154 K/uL (ref 150–400)
RBC: 4.16 MIL/uL (ref 3.87–5.11)
RDW: 13.3 % (ref 11.5–15.5)
WBC: 6 K/uL (ref 4.0–10.5)
nRBC: 0 % (ref 0.0–0.2)

## 2023-12-18 LAB — COMPREHENSIVE METABOLIC PANEL WITH GFR
ALT: 132 U/L — ABNORMAL HIGH (ref 0–44)
AST: 110 U/L — ABNORMAL HIGH (ref 15–41)
Albumin: 3.1 g/dL — ABNORMAL LOW (ref 3.5–5.0)
Alkaline Phosphatase: 102 U/L (ref 38–126)
Anion gap: 11 (ref 5–15)
BUN: 16 mg/dL (ref 8–23)
CO2: 21 mmol/L — ABNORMAL LOW (ref 22–32)
Calcium: 8.4 mg/dL — ABNORMAL LOW (ref 8.9–10.3)
Chloride: 107 mmol/L (ref 98–111)
Creatinine, Ser: 0.57 mg/dL (ref 0.44–1.00)
GFR, Estimated: >60 mL/min (ref >60)
Glucose, Bld: 99 mg/dL (ref 70–99)
Potassium: 4 mmol/L (ref 3.5–5.1)
Sodium: 139 mmol/L (ref 135–145)
Total Bilirubin: 0.8 mg/dL (ref 0.0–1.2)
Total Protein: 4.8 g/dL — ABNORMAL LOW (ref 6.5–8.1)

## 2023-12-18 LAB — URINE CULTURE: Culture: NO GROWTH

## 2023-12-18 MED ORDER — SODIUM CHLORIDE 0.9 % IV SOLN: 2.0000 g | INTRAVENOUS | Status: AC

## 2023-12-18 NOTE — Progress Notes (Signed)
 Report called to [facility]. Patient transported via [facility] ground transport in stable condition.

## 2023-12-18 NOTE — TOC Transition Note (Signed)
 Transition of Care [facility]) - Discharge Note   Patient Details  Name: [name] MRN: [id number] Date of Birth: 19-Mar-1940  [facility] CM/SW Contact:  Sharlyne Stabs, RN Phone Number: 12/18/2023, 10:10 AM   Clinical Narrative:   Transferring to [facility].    Final next level of care: Acute to Acute Transfer Barriers to Discharge: No Barriers Identified   Discharge Placement               Transferring to [facility] Additional resources added to the After Visit Summary for          Social Drivers of Health (SDOH) Interventions SDOH Screenings   Food Insecurity: No Food Insecurity (12/16/2023)  Housing: Low Risk  (12/16/2023)  Transportation Needs: No Transportation Needs (12/16/2023)  Utilities: Not At Risk (12/16/2023)  Social Connections: Moderately Isolated (12/16/2023)  Tobacco Use: Medium Risk (12/16/2023)     Readmission Risk Interventions    12/17/2023   10:29 AM  Readmission Risk Prevention Plan  Post Dischage Appt Complete  Medication Screening Complete  Transportation Screening Complete

## 2023-12-18 NOTE — Progress Notes (Addendum)
 Received a call from Dr. El from virtual ICU at [facility].  He will arrange to have the patient transferred to [facility].  They will call us  back when a bed is available.  No charge note.
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
## 2023-12-19 NOTE — Progress Notes (Signed)
 HOSPITAL MEDICINE -  PROGRESS NOTE  Hospital Day: #1   Admission Date:  12/18/2023   PCP:  Dionne Natalie Holmes, MD   Extended Emergency Contact Information Primary Emergency Contact: Hernandez,Vicente Mobile Phone: [phone] Relation: [facility] Course: [name] is a 83 y.o. woman with a history of bladder cancer s/p induction Gem/Doce (10/02/23-11/13/23), HTN, OSA, CVA, trigeminal neuralgia who presented with shock likely 2/2 hypovolemia and UTI.   Patient presented on 12/10 as transfer from [facility] for shock attributed to UTI in the setting of recent cystoscopy on 12/8.  Lactic acid noted to be 8.0 that improved to 1.4 prior to transfer following administration of 1 L LR bolus, initiation of ceftriaxone , and Levophed  gtt.  UA noted trace leukocytes.  While urine culture showed no growth, patient reported she received dose of Bactrim prior to cystoscopy that could affect culture results.    Prior to patient's transfer, she was able to be weaned from Levophed  without further hypotension.  Patient was closely monitored in the ICU and given stable blood pressures following transfer, stable mentation, and resolution of lactic acidosis at outside hospital patient was deemed stable for transfer to floor evening of 12/10.  Discharge Readiness:  Timeframe: 24 - 48 hours Dispo to: Home with outpatient PT Barriers to Discharge: IV antibiotics  Subjective   Patient is feeling better today.  Still has some weakness, but overall better than prior. She states she has been up urinating all night long. She is walking without difficulty.   Assessment/Plan   Assessment & Plan Septic shock    (CMD) - Reviewed OSH records.  Noted to have lower abdomen and back pain after the procedure.  Prior admission on 7/30 after cystoscopy resulted in acute pyelonephritis with sepsis 2/2 UTI accompanied by hypotension, fever, leukocytosis and lactic acidosis. - Procedure  was completed by Dr. Hulda Finder and he was notified.  With patient opting not to continue any more chemotherapy - UA only noted trace leukocytosis, urine culture negative.  No blood cultures were obtained - CT abdomen pelvis noting small amount of air in bladder associated with procedure.  Cholelithiasis and diverticulitis with no evidence of diverticulitis -CXR noting left lower base atelectasis, left pleural effusion/trace amount. - Patient received 3 L of IV resuscitation pressors were initiated patient started on IV Rocephin  -CBC noted leukocytosis to 15.4, lactic 8 subsequently normalized to 1.3 - Pressors were subsequently discontinued soon after the patient arrived to [facility] -Septic shock likely due to urinary tract infection. - OSH urine cultures negative. - Will continue current treatment with antibiotics.  Acute cystitis Patient likely has UTI which led to sepsis/septic shock. Urine cultures are negative but urine obtained in the setting of recent antibiotic use. - Continue ceftriaxone . Transaminitis - CMP per Care Everywhere noted LFTs within normal limits on 08/2023 - Unclear etiology, but likely secondary to sepsis.  No abdominal pain, nausea or vomiting.   -Hepatitis panel negative. - Will continue to trend transaminases. Keratopathy History of keratopathy since 2017. Patient is on chronic valacyclovir . - Continue home valacyclovir .   DVT Prophylaxis: Lovenox  SubQ  Active Medications: cefTRIAXone , 2 g, intravenous, Q24H SCH enoxaparin , 40 mg, subcutaneous, At Bedtime lidocaine , 1 patch, topical, Daily valACYclovir , 1,000 mg, oral, Daily   Infusions:   Objective   Temp:  [96.9 F (36.1 C)-99.3 F (37.4 C)] 97.3 F (36.3 C) Heart Rate:  [65-89] 89 Resp:  [14-25] 18 BP: (133-173)/(68-87) 139/80      Physical Exam:  General: Alert, oriented X 3, no apparent distress.  Head-Eyes-Ears-Nose-Throat: EOMI, pupils equal, round and reactive to light,  oropharynx  clear, no LAD.  Cardiovascular: Regular rate and rhythm, s1, s2, no murmurs.  Chest/Lungs: Clear to auscultation bilaterally, no wheezes or rhonchi.  Abdomen: Positive bowel sounds. Abdomen soft, nondistended, nontender. No HSM, rebound or guarding.  Extremities: No pedal edema  Skin: Warm and dry  Other: No focal neurological deficit     Labs   Results from last 2 days  Lab Units 12/19/23 0429 12/18/23 1322  WHITE BLOOD CELL COUNT 10*3/uL 4.80 4.90  HEMOGLOBIN g/dL 85.5 86.3  HEMATOCRIT % 39.7 39.7  PLATELET COUNT 10*3/uL 133* 138*    Results from last 2 days  Lab Units 12/19/23 0429 12/18/23 1322  SODIUM mmol/L 137 140  POTASSIUM mmol/L 4.2 4.2  CHLORIDE mmol/L 105 107  CO2 mmol/L 26 27  BUN mg/dL 10 14  CREATININE mg/dL 9.46* 9.40*  GLUCOSE mg/dL 83 96  CALCIUM  mg/dL 8.7 8.6    Results from last 2 days  Lab Units 12/19/23 0429 12/18/23 1322  MAGNESIUM mg/dL 1.8* 1.8*    Electronically signed by: Golden Hashimoto, MD 12/19/2023 3:21 PM

## 2023-12-21 LAB — CULTURE, BLOOD (ROUTINE X 2)
Culture: NO GROWTH
Culture: NO GROWTH
Special Requests: ADEQUATE
